# Patient Record
Sex: Male | Born: 1955
Health system: Southern US, Community
[De-identification: ages and names within clinical notes are randomized; demographics above are authoritative.]

## PROBLEM LIST (undated history)

## (undated) DIAGNOSIS — B1710 Acute hepatitis C without hepatic coma: Secondary | ICD-10-CM

## (undated) DIAGNOSIS — E739 Lactose intolerance, unspecified: Secondary | ICD-10-CM

## (undated) DIAGNOSIS — M1710 Unilateral primary osteoarthritis, unspecified knee: Secondary | ICD-10-CM

## (undated) DIAGNOSIS — J309 Allergic rhinitis, unspecified: Secondary | ICD-10-CM

## (undated) DIAGNOSIS — E785 Hyperlipidemia, unspecified: Secondary | ICD-10-CM

## (undated) DIAGNOSIS — M25569 Pain in unspecified knee: Secondary | ICD-10-CM

## (undated) DIAGNOSIS — R7302 Impaired glucose tolerance (oral): Secondary | ICD-10-CM

## (undated) DIAGNOSIS — M76899 Other specified enthesopathies of unspecified lower limb, excluding foot: Secondary | ICD-10-CM

## (undated) HISTORY — DX: Allergic rhinitis, unspecified: J30.9

## (undated) HISTORY — DX: Unilateral primary osteoarthritis, unspecified knee: M17.10

## (undated) HISTORY — DX: Acute hepatitis C without hepatic coma: B17.10

## (undated) HISTORY — DX: Impaired glucose tolerance (oral): R73.02

## (undated) HISTORY — DX: Lactose intolerance, unspecified: E73.9

## (undated) HISTORY — DX: Hyperlipidemia, unspecified: E78.5

## (undated) HISTORY — DX: Other specified enthesopathies of unspecified lower limb, excluding foot: M76.899

## (undated) HISTORY — DX: Pain in unspecified knee: M25.569

---

## 2003-03-21 ENCOUNTER — Ambulatory Visit (HOSPITAL_COMMUNITY): Admission: RE | Admit: 2003-03-21 | Discharge: 2003-03-21 | Payer: Self-pay | Admitting: Orthopedic Surgery

## 2004-04-09 ENCOUNTER — Ambulatory Visit (HOSPITAL_COMMUNITY): Admission: RE | Admit: 2004-04-09 | Discharge: 2004-04-09 | Payer: Self-pay | Admitting: Internal Medicine

## 2004-04-09 ENCOUNTER — Encounter (INDEPENDENT_AMBULATORY_CARE_PROVIDER_SITE_OTHER): Payer: Self-pay | Admitting: Specialist

## 2004-09-16 ENCOUNTER — Ambulatory Visit: Payer: Self-pay | Admitting: Internal Medicine

## 2004-09-20 ENCOUNTER — Ambulatory Visit: Payer: Self-pay | Admitting: Internal Medicine

## 2005-04-22 ENCOUNTER — Ambulatory Visit: Payer: Self-pay | Admitting: Internal Medicine

## 2005-10-10 ENCOUNTER — Ambulatory Visit: Payer: Self-pay | Admitting: Internal Medicine

## 2005-10-18 ENCOUNTER — Ambulatory Visit: Payer: Self-pay | Admitting: Internal Medicine

## 2006-10-16 ENCOUNTER — Ambulatory Visit: Payer: Self-pay | Admitting: Internal Medicine

## 2006-10-16 LAB — CONVERTED CEMR LAB
AST: 52 units/L — ABNORMAL HIGH (ref 0–37)
Basophils Absolute: 0 10*3/uL (ref 0.0–0.1)
Calcium: 9.2 mg/dL (ref 8.4–10.5)
Creatinine, Ser: 1.1 mg/dL (ref 0.4–1.5)
Eosinophil percent: 7.3 % — ABNORMAL HIGH (ref 0.0–5.0)
GFR calc non Af Amer: 75 mL/min
Hemoglobin: 14.6 g/dL (ref 13.0–17.0)
Ketones, ur: NEGATIVE mg/dL
LDL Cholesterol: 123 mg/dL — ABNORMAL HIGH (ref 0–99)
Leukocytes, UA: NEGATIVE
Monocytes Absolute: 0.5 10*3/uL (ref 0.2–0.7)
Neutrophils Relative %: 35.2 % — ABNORMAL LOW (ref 43.0–77.0)
PSA: 1 ng/mL (ref 0.10–4.00)
Potassium: 3.9 meq/L (ref 3.5–5.1)
RDW: 12.5 % (ref 11.5–14.6)
Specific Gravity, Urine: 1.025 (ref 1.000–1.03)
Total Protein, Urine: NEGATIVE mg/dL
Total Protein: 7.9 g/dL (ref 6.0–8.3)
Urine Glucose: NEGATIVE mg/dL
pH: 5.5 (ref 5.0–8.0)

## 2006-11-30 ENCOUNTER — Ambulatory Visit: Payer: Self-pay | Admitting: Internal Medicine

## 2007-07-17 ENCOUNTER — Emergency Department (HOSPITAL_COMMUNITY): Admission: EM | Admit: 2007-07-17 | Discharge: 2007-07-17 | Payer: Self-pay | Admitting: Emergency Medicine

## 2007-11-27 ENCOUNTER — Ambulatory Visit: Payer: Self-pay | Admitting: Internal Medicine

## 2007-11-27 LAB — CONVERTED CEMR LAB
ALT: 108 units/L — ABNORMAL HIGH (ref 0–53)
Albumin: 3.6 g/dL (ref 3.5–5.2)
Alkaline Phosphatase: 51 units/L (ref 39–117)
BUN: 9 mg/dL (ref 6–23)
Basophils Relative: 0.7 % (ref 0.0–1.0)
CO2: 30 meq/L (ref 19–32)
Calcium: 9.2 mg/dL (ref 8.4–10.5)
Chloride: 107 meq/L (ref 96–112)
Cholesterol: 202 mg/dL (ref 0–200)
Eosinophils Relative: 12.1 % — ABNORMAL HIGH (ref 0.0–5.0)
Glucose, Bld: 109 mg/dL — ABNORMAL HIGH (ref 70–99)
HCT: 42.4 % (ref 39.0–52.0)
Ketones, ur: NEGATIVE mg/dL
MCV: 93 fL (ref 78.0–100.0)
Monocytes Absolute: 0.5 10*3/uL (ref 0.2–0.7)
Neutro Abs: 1.9 10*3/uL (ref 1.4–7.7)
PSA: 0.69 ng/mL (ref 0.10–4.00)
Potassium: 3.8 meq/L (ref 3.5–5.1)
RBC: 4.56 M/uL (ref 4.22–5.81)
Sodium: 143 meq/L (ref 135–145)
Specific Gravity, Urine: 1.02 (ref 1.000–1.03)
TSH: 3.04 microintl units/mL (ref 0.35–5.50)
Total Bilirubin: 1 mg/dL (ref 0.3–1.2)
Total Protein, Urine: NEGATIVE mg/dL
Total Protein: 7.7 g/dL (ref 6.0–8.3)
Triglycerides: 78 mg/dL (ref 0–149)
Urine Glucose: NEGATIVE mg/dL
Urobilinogen, UA: 0.2 (ref 0.0–1.0)

## 2007-12-05 ENCOUNTER — Ambulatory Visit: Payer: Self-pay | Admitting: Internal Medicine

## 2007-12-05 DIAGNOSIS — E785 Hyperlipidemia, unspecified: Secondary | ICD-10-CM

## 2007-12-05 DIAGNOSIS — B182 Chronic viral hepatitis C: Secondary | ICD-10-CM | POA: Insufficient documentation

## 2007-12-05 DIAGNOSIS — J309 Allergic rhinitis, unspecified: Secondary | ICD-10-CM | POA: Insufficient documentation

## 2007-12-05 DIAGNOSIS — B1710 Acute hepatitis C without hepatic coma: Secondary | ICD-10-CM

## 2007-12-05 DIAGNOSIS — E739 Lactose intolerance, unspecified: Secondary | ICD-10-CM | POA: Insufficient documentation

## 2007-12-05 HISTORY — DX: Allergic rhinitis, unspecified: J30.9

## 2007-12-05 HISTORY — DX: Hyperlipidemia, unspecified: E78.5

## 2007-12-05 HISTORY — DX: Lactose intolerance, unspecified: E73.9

## 2007-12-05 HISTORY — DX: Acute hepatitis C without hepatic coma: B17.10

## 2008-04-30 ENCOUNTER — Ambulatory Visit: Payer: Self-pay | Admitting: Internal Medicine

## 2008-04-30 DIAGNOSIS — M76899 Other specified enthesopathies of unspecified lower limb, excluding foot: Secondary | ICD-10-CM

## 2008-04-30 DIAGNOSIS — IMO0002 Reserved for concepts with insufficient information to code with codable children: Secondary | ICD-10-CM

## 2008-04-30 DIAGNOSIS — M1710 Unilateral primary osteoarthritis, unspecified knee: Secondary | ICD-10-CM

## 2008-04-30 HISTORY — DX: Other specified enthesopathies of unspecified lower limb, excluding foot: M76.899

## 2008-04-30 HISTORY — DX: Reserved for concepts with insufficient information to code with codable children: IMO0002

## 2009-01-06 ENCOUNTER — Ambulatory Visit: Payer: Self-pay | Admitting: Internal Medicine

## 2009-01-06 LAB — CONVERTED CEMR LAB
ALT: 125 units/L — ABNORMAL HIGH (ref 0–53)
Albumin: 3.5 g/dL (ref 3.5–5.2)
Alkaline Phosphatase: 52 units/L (ref 39–117)
Bilirubin, Direct: 0.1 mg/dL (ref 0.0–0.3)
Cholesterol: 197 mg/dL (ref 0–200)
Eosinophils Absolute: 0.5 10*3/uL (ref 0.0–0.7)
Eosinophils Relative: 11.8 % — ABNORMAL HIGH (ref 0.0–5.0)
Glucose, Bld: 110 mg/dL — ABNORMAL HIGH (ref 70–99)
HCT: 41.5 % (ref 39.0–52.0)
Hemoglobin: 13.8 g/dL (ref 13.0–17.0)
Leukocytes, UA: NEGATIVE
MCHC: 33.3 g/dL (ref 30.0–36.0)
MCV: 94.7 fL (ref 78.0–100.0)
Monocytes Relative: 12.1 % — ABNORMAL HIGH (ref 3.0–12.0)
Neutro Abs: 1.3 10*3/uL — ABNORMAL LOW (ref 1.4–7.7)
Neutrophils Relative %: 29 % — ABNORMAL LOW (ref 43.0–77.0)
PSA: 0.74 ng/mL (ref 0.10–4.00)
Platelets: 201 10*3/uL (ref 150–400)
Potassium: 3.8 meq/L (ref 3.5–5.1)
RBC: 4.38 M/uL (ref 4.22–5.81)
RDW: 12.7 % (ref 11.5–14.6)
Sodium: 145 meq/L (ref 135–145)
Total CHOL/HDL Ratio: 3.4
Total Protein: 7.5 g/dL (ref 6.0–8.3)
Urine Glucose: NEGATIVE mg/dL
VLDL: 16 mg/dL (ref 0–40)
pH: 5.5 (ref 5.0–8.0)

## 2009-02-24 ENCOUNTER — Ambulatory Visit: Payer: Self-pay | Admitting: Internal Medicine

## 2009-04-14 ENCOUNTER — Ambulatory Visit: Payer: Self-pay | Admitting: Gastroenterology

## 2009-04-28 ENCOUNTER — Encounter: Payer: Self-pay | Admitting: Gastroenterology

## 2009-04-28 ENCOUNTER — Encounter: Payer: Self-pay | Admitting: Internal Medicine

## 2009-04-28 ENCOUNTER — Ambulatory Visit: Payer: Self-pay | Admitting: Gastroenterology

## 2009-04-29 ENCOUNTER — Encounter: Payer: Self-pay | Admitting: Gastroenterology

## 2009-10-07 ENCOUNTER — Ambulatory Visit: Payer: Self-pay | Admitting: Internal Medicine

## 2009-10-19 ENCOUNTER — Ambulatory Visit: Payer: Self-pay | Admitting: Internal Medicine

## 2010-03-04 ENCOUNTER — Ambulatory Visit: Payer: Self-pay | Admitting: Internal Medicine

## 2010-03-04 LAB — CONVERTED CEMR LAB
AST: 89 units/L — ABNORMAL HIGH (ref 0–37)
Alkaline Phosphatase: 49 units/L (ref 39–117)
Basophils Relative: 1.2 % (ref 0.0–3.0)
Chloride: 106 meq/L (ref 96–112)
Eosinophils Relative: 11.5 % — ABNORMAL HIGH (ref 0.0–5.0)
GFR calc non Af Amer: 99.75 mL/min (ref >60)
HCV Ab: REACTIVE — AB
HDL: 63.6 mg/dL (ref >39.00)
Hemoglobin: 14.1 g/dL (ref 13.0–17.0)
Hep B C IgM: NEGATIVE
Hepatitis B Surface Ag: NEGATIVE
Ketones, ur: NEGATIVE mg/dL
Leukocytes, UA: NEGATIVE
Lymphocytes Relative: 46.6 % — ABNORMAL HIGH (ref 12.0–46.0)
MCHC: 33.7 g/dL (ref 30.0–36.0)
MCV: 94 fL (ref 78.0–100.0)
RBC: 4.44 M/uL (ref 4.22–5.81)
TSH: 2.45 microintl units/mL (ref 0.35–5.50)
Total Bilirubin: 1 mg/dL (ref 0.3–1.2)
Total Protein: 8.3 g/dL (ref 6.0–8.3)
Urobilinogen, UA: 0.2 (ref 0.0–1.0)
pH: 5.5 (ref 5.0–8.0)

## 2010-03-09 ENCOUNTER — Ambulatory Visit: Payer: Self-pay | Admitting: Internal Medicine

## 2010-03-09 DIAGNOSIS — M25569 Pain in unspecified knee: Secondary | ICD-10-CM

## 2010-03-09 HISTORY — DX: Pain in unspecified knee: M25.569

## 2010-03-10 ENCOUNTER — Encounter (INDEPENDENT_AMBULATORY_CARE_PROVIDER_SITE_OTHER): Payer: Self-pay | Admitting: *Deleted

## 2010-05-03 ENCOUNTER — Encounter: Payer: Self-pay | Admitting: Internal Medicine

## 2010-10-21 ENCOUNTER — Ambulatory Visit: Payer: Self-pay | Admitting: Internal Medicine

## 2010-12-14 NOTE — Letter (Signed)
Summary: [facility]  [facility]   Imported By: Sherian Rein 05/13/2010 12:26:36  _____________________________________________________________________  External Attachment:    Type:   Image     Comment:   External Document

## 2010-12-14 NOTE — Assessment & Plan Note (Signed)
Summary: [facility]X/[facility]/CD   Vital Signs:  Patient profile:   55 year old male Height:      66 inches Weight:      182.50 pounds BMI:     29.56 O2 Sat:      96 % on Room air Temp:     98.1 degrees F oral Pulse rate:   72 / minute BP sitting:   120 / 82  (left arm) Cuff size:   regular  Vitals Entered ByZella Ball Ewing (March 09, 2010 8:23 AM)  O2 Flow:  Room air  CC: Adult Physical/Re   CC:  Adult Physical/Re.  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  Pt denies CP, sob, doe, wheezing, orthopnea, pnd, worsening LE edema, palps, dizziness or syncope Pt denies new neuro symptoms such as headache, facial or extremity weakness   Problems Prior to Update: 1)  Preventive Health Care  (ICD-V70.0) 2)  Bursitis, Left Hip  (ICD-726.5) 3)  Osteoarthritis, Knees, Bilateral  (ICD-715.96) 4)  Glucose Intolerance  (ICD-271.3) 5)  Allergic Rhinitis  (ICD-477.9) 6)  Hepatitis C  (ICD-070.51) 7)  Hyperlipidemia  (ICD-272.4) 8)  Preventive Health Care  (ICD-V70.0) 9)  Routine General Medical Exam@health  Care Facl  (ICD-V70.0)  Medications Prior to Update: 1)  Fexofenadine Hcl 180 Mg  Tabs (Fexofenadine Hcl) .Marland Kitchen.. 1 By Mouth Once Daily 2)  Meloxicam 15 Mg  Tabs (Meloxicam) .Marland Kitchen.. 1 By Mouth Once Daily As Needed 3)  Pravastatin Sodium 40 Mg Tabs (Pravastatin Sodium) .Marland Kitchen.. 1 By Mouth Once Daily 4)  Adult Aspirin Ec Low Strength 81 Mg Tbec (Aspirin) .Marland Kitchen.. 1po Once Daily 5)  Pravastatin Sodium 40 Mg Tabs (Pravastatin Sodium) .Marland Kitchen.. 1 By Mouth Once Daily  Current Medications (verified): 1)  Fexofenadine Hcl 180 Mg  Tabs (Fexofenadine Hcl) .Marland Kitchen.. 1 By Mouth Once Daily 2)  Meloxicam 15 Mg  Tabs (Meloxicam) .Marland Kitchen.. 1 By  Mouth Once Daily As Needed For Pain 3)  Pravastatin Sodium 40 Mg Tabs (Pravastatin Sodium) .Marland Kitchen.. 1 By Mouth Once Daily 4)  Adult Aspirin Ec Low Strength 81 Mg Tbec (Aspirin) .Marland Kitchen.. 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         all otherwise negative per pt -    Physical Exam  General:  alert and well-developed.  , mild overwt Head:  normocephalic and atraumatic.   Eyes:  vision grossly intact, pupils equal, and pupils round.  Ears:  R ear normal and L ear normal.   Nose:  no external deformity and no nasal discharge.   Mouth:  no gingival abnormalities and pharynx pink and moist.   Neck:  supple and no masses.   Lungs:  normal respiratory effort and normal breath sounds.   Heart:  normal rate and regular rhythm.   Abdomen:  soft, non-tender, and normal bowel sounds.   Msk:  no joint tenderness and no joint swelling but  has bilat mld knee crepitus and FROM, also with some tender over left AC joint.   Extremities:   no edema, no erythema  Neurologic:  cranial nerves II-XII intact and strength normal in all extremities.   Skin:  color normal and no rashes.   Psych:  not anxious appearing and not depressed appearing.     Impression & Recommendations:  Problem # 1:  Preventive Health Care (ICD-V70.0) Overall doing well, age appropriate education and counseling updated and referral for appropriate preventive services done unless declined, immunizations up to date or declined, diet counseling done if overweight, urged to quit smoking if smokes , most recent labs reviewed and current ordered if appropriate, ecg reviewed or declined (interpretation per ECG scanned in the EMR if done); information regarding Medicare Prevention requirements given if appropriate  Orders: EKG w/ Interpretation (93000)  Problem # 2:  KNEE PAIN, BILATERAL (ICD-719.46)  His updated medication list for this problem includes:    Meloxicam 15 Mg Tabs (Meloxicam) .Marland Kitchen... 1 by mouth once daily as needed for pain    Adult Aspirin Ec Low Strength 81 Mg Tbec (Aspirin) .Marland Kitchen... 1po once daily Continue all previous medications as before this visit , as well as tylenol as needed, with incr pain and effusions will refer to ortho  Orders: Orthopedic Surgeon Referral (Ortho Surgeon)  Complete Medication List: 1)  Fexofenadine Hcl 180 Mg Tabs (Fexofenadine hcl) .Marland Kitchen.. 1 by mouth once daily 2)  Meloxicam 15 Mg Tabs (Meloxicam) .Marland Kitchen.. 1 by mouth once daily as needed for pain 3)  Pravastatin Sodium 40 Mg Tabs (Pravastatin sodium) .Marland Kitchen.. 1 by mouth once daily 4)  Adult Aspirin Ec Low Strength 81 Mg Tbec (Aspirin) .Marland Kitchen.. 1po once daily  Patient Instructions: 1)  Continue all previous medications as before this visit  2)  You will be contacted about the referral(s) to: orthopedic 3)  You can also use Tylenol arthritis or it's generic for congestion  4)  Please schedule a follow-up appointment in 1 year or sooner if needed Prescriptions: PRAVASTATIN SODIUM  40 MG TABS (PRAVASTATIN SODIUM) 1 by mouth once daily  #90 x 3   Entered and Authorized by:   Corwin Levins MD   Signed by:   Corwin Levins MD on 03/09/2010   Method used:   Print then Give to Patient   RxID:   [id number] MELOXICAM 15 MG  TABS (MELOXICAM) 1 by mouth once daily as needed for pain  #90 x 3   Entered and Authorized by:   Corwin Levins MD   Signed by:   Corwin Levins MD on 03/09/2010   Method used:   Print then Give to Patient   RxID:   [id number] FEXOFENADINE HCL 180 MG  TABS (FEXOFENADINE HCL) 1 by mouth once daily  #90 x 3   Entered and Authorized by:   Corwin Levins MD   Signed by:   Corwin Levins MD on 03/09/2010   Method used:   Print then Give to  Patient   RxID:   [id number]

## 2010-12-14 NOTE — Letter (Signed)
Summary: [facility] Consult Scheduled Letter  [facility]  [address] [place], [place] [address]   Phone: [phone]  Fax: [phone]      03/10/2010 MRN: [id number]  Manuel Berger [address] [place], [place]  [address]    Dear [name],      We have scheduled an appointment for you.  At the recommendation of Dr.John, we have scheduled you a consult with Dr Rennis Chris on 03/17/10 at 8:00am.  Their phone number is [phone].  If this appointment day and time is not convenient for you, please feel free to call the office of the doctor you are being referred to at the number listed above and reschedule the appointment.    [facility] [address] [place], [place] [address]    Thank you,  Patient Care Coordinator [facility]

## 2011-03-14 ENCOUNTER — Other Ambulatory Visit (INDEPENDENT_AMBULATORY_CARE_PROVIDER_SITE_OTHER): Payer: 59

## 2011-03-14 ENCOUNTER — Other Ambulatory Visit: Payer: Self-pay

## 2011-03-14 DIAGNOSIS — Z0000 Encounter for general adult medical examination without abnormal findings: Secondary | ICD-10-CM

## 2011-03-14 DIAGNOSIS — Z1289 Encounter for screening for malignant neoplasm of other sites: Secondary | ICD-10-CM

## 2011-03-14 DIAGNOSIS — E785 Hyperlipidemia, unspecified: Secondary | ICD-10-CM

## 2011-03-14 LAB — LIPID PANEL: Total CHOL/HDL Ratio: 3

## 2011-03-14 LAB — BASIC METABOLIC PANEL
CO2: 29 mEq/L (ref 19–32)
Creatinine, Ser: 0.9 mg/dL (ref 0.4–1.5)
GFR: 116.7 mL/min (ref >60.00)
Glucose, Bld: 89 mg/dL (ref 70–99)
Potassium: 4 mEq/L (ref 3.5–5.1)
Sodium: 139 mEq/L (ref 135–145)

## 2011-03-14 LAB — PSA: PSA: 0.89 ng/mL (ref 0.10–4.00)

## 2011-03-14 LAB — URINALYSIS
Nitrite: NEGATIVE
Total Protein, Urine: NEGATIVE
Urine Glucose: NEGATIVE
Urobilinogen, UA: 0.2 (ref 0.0–1.0)

## 2011-03-14 LAB — CBC WITH DIFFERENTIAL/PLATELET
Eosinophils Relative: 6.4 % — ABNORMAL HIGH (ref 0.0–5.0)
Hemoglobin: 14.3 g/dL (ref 13.0–17.0)
Lymphocytes Relative: 49.1 % — ABNORMAL HIGH (ref 12.0–46.0)
Monocytes Absolute: 0.7 10*3/uL (ref 0.1–1.0)
Neutrophils Relative %: 32.9 % — ABNORMAL LOW (ref 43.0–77.0)
RBC: 4.47 Mil/uL (ref 4.22–5.81)
WBC: 6.4 10*3/uL (ref 4.5–10.5)

## 2011-03-14 LAB — TSH: TSH: 3.21 u[IU]/mL (ref 0.35–5.50)

## 2011-03-14 LAB — HEPATIC FUNCTION PANEL: Total Protein: 7.5 g/dL (ref 6.0–8.3)

## 2011-03-17 ENCOUNTER — Other Ambulatory Visit: Payer: Self-pay | Admitting: Internal Medicine

## 2011-03-17 DIAGNOSIS — Z1289 Encounter for screening for malignant neoplasm of other sites: Secondary | ICD-10-CM

## 2011-03-17 DIAGNOSIS — Z0000 Encounter for general adult medical examination without abnormal findings: Secondary | ICD-10-CM

## 2011-03-23 ENCOUNTER — Encounter: Payer: Self-pay | Admitting: Internal Medicine

## 2011-03-23 DIAGNOSIS — Z0000 Encounter for general adult medical examination without abnormal findings: Secondary | ICD-10-CM | POA: Insufficient documentation

## 2011-03-23 DIAGNOSIS — R739 Hyperglycemia, unspecified: Secondary | ICD-10-CM | POA: Insufficient documentation

## 2011-03-23 DIAGNOSIS — R7302 Impaired glucose tolerance (oral): Secondary | ICD-10-CM

## 2011-03-23 HISTORY — DX: Impaired glucose tolerance (oral): R73.02

## 2011-03-24 ENCOUNTER — Encounter: Payer: Self-pay | Admitting: Internal Medicine

## 2011-03-24 ENCOUNTER — Ambulatory Visit (INDEPENDENT_AMBULATORY_CARE_PROVIDER_SITE_OTHER)
Admission: RE | Admit: 2011-03-24 | Discharge: 2011-03-24 | Disposition: A | Discharge status: Home / Self Care | Payer: 59 | Source: Ambulatory Visit | Attending: Internal Medicine | Admitting: Internal Medicine

## 2011-03-24 ENCOUNTER — Ambulatory Visit (INDEPENDENT_AMBULATORY_CARE_PROVIDER_SITE_OTHER): Payer: 59 | Admitting: Internal Medicine

## 2011-03-24 VITALS — BP 122/80 | HR 65 | Temp 99.0°F | Ht 66.0 in | Wt 179.2 lb

## 2011-03-24 DIAGNOSIS — Z136 Encounter for screening for cardiovascular disorders: Secondary | ICD-10-CM

## 2011-03-24 DIAGNOSIS — R7302 Impaired glucose tolerance (oral): Secondary | ICD-10-CM

## 2011-03-24 DIAGNOSIS — S93401A Sprain of unspecified ligament of right ankle, initial encounter: Secondary | ICD-10-CM

## 2011-03-24 DIAGNOSIS — S93409A Sprain of unspecified ligament of unspecified ankle, initial encounter: Secondary | ICD-10-CM

## 2011-03-24 DIAGNOSIS — R7309 Other abnormal glucose: Secondary | ICD-10-CM

## 2011-03-24 DIAGNOSIS — Z0000 Encounter for general adult medical examination without abnormal findings: Secondary | ICD-10-CM

## 2011-03-24 MED ORDER — TRAMADOL HCL 50 MG PO TABS: 50.0000 mg | ORAL_TABLET | Freq: Four times a day (QID) | ORAL | Status: AC | PRN

## 2011-03-24 NOTE — Progress Notes (Signed)
Subjective:    Patient ID: Manuel Berger, male    DOB: 07-25-1956, 55 y.o.   MRN: [id number]  HPI Here for wellness and f/u;  Overall doing ok;  Pt denies CP, worsening SOB, DOE, wheezing, orthopnea, PND, worsening LE edema, palpitations, dizziness or syncope.  Pt denies neurological change such as new Headache, facial or extremity weakness.  Pt denies polydipsia, polyuria, or low sugar symptoms. Pt states overall good compliance with treatment and medications, good tolerability, and trying to follow lower cholesterol diet.  Pt denies worsening depressive symptoms, suicidal ideation or panic. No fever, wt loss, night sweats, loss of appetite, or other constitutional symptoms.  Pt states good ability with ADL's, low fall risk, home safety reviewed and adequate, no significant changes in hearing or vision, and occasionally active with exercise.  Has some ongoing knee pain, better with bilat cortisone last yr, but not bad enough at this time to return to ortho;  No giveaways, no sweling,  Not taking the mobic at this time  Here with pain to the right lateral ankle after twisting the ankle 2 wks ago, still hurts, but swelling , worse pain to walk,.  Also pain to left elbow for 2 mo, has physical job, uses arms at work, but pain mostly at the tip of the elbow, no trauma .  Past Medical History  Diagnosis Date  . HEPATITIS C 12/05/2007  . GLUCOSE INTOLERANCE 12/05/2007  . HYPERLIPIDEMIA 12/05/2007  . ALLERGIC RHINITIS 12/05/2007  . OSTEOARTHRITIS, KNEES, BILATERAL 04/30/2008  . KNEE PAIN, BILATERAL 03/09/2010  . BURSITIS, LEFT HIP 04/30/2008  . Impaired glucose tolerance 03/23/2011   No past surgical history on file.  reports that he has never smoked. He does not have any smokeless tobacco history on file. He reports that he does not drink alcohol. His drug history not on file. family history includes COPD in his father; Diabetes in his mother; Heart disease in his father; and Hypertension in his mother. No  Known Allergies Current Outpatient Prescriptions on File Prior to Visit  Medication Sig Dispense Refill  . aspirin 81 MG EC tablet Take 81 mg by mouth daily.        . fexofenadine (ALLEGRA) 180 MG tablet Take 180 mg by mouth daily.        . pravastatin (PRAVACHOL) 40 MG tablet Take 40 mg by mouth daily.        . meloxicam (MOBIC) 15 MG tablet Take 15 mg by mouth daily.         Review of Systems Review of Systems  Constitutional: Negative for diaphoresis, activity change, appetite change and unexpected weight change.  HENT: Negative for hearing loss, ear pain, facial swelling, mouth sores and neck stiffness.   Eyes: Negative for pain, redness and visual disturbance.  Respiratory: Negative for shortness of breath and wheezing.   Cardiovascular: Negative for chest pain and palpitations.  Gastrointestinal: Negative for diarrhea, blood in stool, abdominal distention and rectal pain.  Genitourinary: Negative for hematuria, flank pain and decreased urine volume.  Musculoskeletal: Negative for myalgias and joint swelling.  Skin: Negative for color change and wound.  Neurological: Negative for syncope and numbness.  Hematological: Negative for adenopathy.  Psychiatric/Behavioral: Negative for hallucinations, self-injury, decreased concentration and agitation.      Objective:   Physical Exam BP 122/80  Pulse 65  Temp(Src) 99 F (37.2 C) (Oral)  Ht 5\' 6"  (1.676 m)  Wt 179 lb 4 oz (81.307 kg)  BMI 28.93 kg/m2  SpO2 97%  Physical Exam  VS noted Constitutional: Pt is oriented to person, place, and time. Appears well-developed and well-nourished.  HENT:  Head: Normocephalic and atraumatic.  Right Ear: External ear normal.  Left Ear: External ear normal.  Nose: Nose normal.  Mouth/Throat: Oropharynx is clear and moist.  Eyes: Conjunctivae and EOM are normal. Pupils are equal, round, and reactive to light.  Neck: Normal range of motion. Neck supple. No JVD present. No tracheal deviation  present.  Cardiovascular: Normal rate, regular rhythm, normal heart sounds and intact distal pulses.   Pulmonary/Chest: Effort normal and breath sounds normal.  Abdominal: Soft. Bowel sounds are normal. There is no tenderness.  Musculoskeletal: Normal range of motion. Exhibits no edema. except for right lateral ankle with effusion about the lateral malleous and tender tarsal tunnel area, worse pain to inversion of ankle; left elbow with ? Mild bursitis, but minimal effusion at best, nontender, FROM Lymphadenopathy:  Has no cervical adenopathy.  Neurological: Pt is alert and oriented to person, place, and time. Pt has normal reflexes. No cranial nerve deficit.  Skin: Skin is warm and dry. No rash noted.  Psychiatric:  Has  normal mood and affect. Behavior is normal.        Assessment & Plan:

## 2011-03-24 NOTE — Progress Notes (Signed)
Quick Note:  Voice message left on [employer] system - lab is negative, normal or otherwise stable, pt to continue same tx ______ 

## 2011-03-24 NOTE — Assessment & Plan Note (Signed)

## 2011-03-24 NOTE — Assessment & Plan Note (Signed)
Mild to mod, persistent pain and swelling , for right ankle film today, pain med prn

## 2011-03-24 NOTE — Patient Instructions (Signed)
Take all new medications as prescribed Continue all other medications as before Please go to XRAY in the Basement for the x-ray test Please call the number on the [facility] Card (the [employer] System) for results of testing in 2-3 days Your medications were refilled today Please return in 1 year for your yearly visit, or sooner if needed, with Lab testing done 3-5 days before

## 2011-03-30 ENCOUNTER — Other Ambulatory Visit: Payer: Self-pay | Admitting: Internal Medicine

## 2011-04-01 NOTE — Op Note (Signed)
NAME:  Manuel Berger, Manuel Berger                       ACCOUNT NO.:  [id number]   MEDICAL RECORD NO.:  [id number]                   PATIENT TYPE:  OIB   LOCATION:  NA                                   FACILITY:  [facility]   PHYSICIAN:  Almedia Balls. Ranell Patrick, M.D.              DATE OF BIRTH:  02-07-1956   DATE OF PROCEDURE:  03/21/2003  DATE OF DISCHARGE:                                 OPERATIVE REPORT   PREOPERATIVE DIAGNOSIS:  Right triceps tear.   POSTOPERATIVE DIAGNOSIS:  Right triceps tear.   PROCEDURE:  Open repair of right triceps tendon, right arm.   SURGEON:  Almedia Balls. Ranell Patrick, M.D.   ASSISTANT:  Clarene Reamer, P.A.-C.   ANESTHESIA:  General anesthesia.   ESTIMATED BLOOD LOSS:  Minimal.   TOURNIQUET TIME:  35 minutes.   COUNTS:  Instrument count was correct.   COMPLICATIONS:  None.   INDICATIONS:  The patient is a 55 year old male who sustained an injury to  his right arm when it was forcefully flexed against his extended arm.  The  patient complained of immediate severe pain in the right elbow with swelling  and ecchymosis.  X-rays demonstrate an avulsion of the triceps where the  small bones appear attached.  The patient clinically had the same, was  unable to actively extend his arm, with a palpable defect.  After counseling  the patient regarding the need for repairing the extensor mechanism of the  elbow, the patient consented to surgery.  After preoperative clearance by  medicine, the patient was taken back to surgery.   DESCRIPTION OF PROCEDURE:  After an adequate level of anesthesia achieved,  the patient was positioned supine on the operating table and a nonsterile  tourniquet was placed on the right proximal arm.  The right arm was then  prepped and draped in the usual sterile fashion.  After completing this,  using the Esmarch bandage, the tourniquet was elevated to 275 mmHg.  A  longitudinal incision was created over the elbow and dissection was carried  down  sharply down to the triceps, which was identified.  There was a full-  thickness tear noted off the olecranon.  A bone spur was removed from the  free edge of the triceps tendon.  Two #2 Fibrewire sutures were placed in a  [place] baseball-stitch-type fashion into the free edge of the tendon and  used to reapproximate the tendon directly through drill holes into the  olecranon process, which was freshened up using a rongeur and a curette.  An  excellent repair was achieved.  This was augmented with #1 Vicryl in the  retinacular tissues in a figure-of-eight interrupted fashion.  Care was  taken to preserve the ulnar nerve, which was not visualized and was noted to  be  outside the operative field.  At this point thorough irrigation was  performed, performed by 2-0 Vicryl closure and staples for the skin and  sterile dressings applied with the elbow splinted in extension.  The patient  tolerated the procedure well and was taken to the recovery room in stable  condition.                                               Almedia Balls. Ranell Patrick, M.D.    SRN/MEDQ  D:  03/21/2003  T:  03/24/2003  Job:  [id number]

## 2011-10-14 ENCOUNTER — Ambulatory Visit (INDEPENDENT_AMBULATORY_CARE_PROVIDER_SITE_OTHER): Payer: 59

## 2011-10-14 DIAGNOSIS — Z23 Encounter for immunization: Secondary | ICD-10-CM

## 2012-03-16 ENCOUNTER — Other Ambulatory Visit (INDEPENDENT_AMBULATORY_CARE_PROVIDER_SITE_OTHER): Payer: 59

## 2012-03-16 ENCOUNTER — Encounter: Payer: Self-pay | Admitting: Internal Medicine

## 2012-03-16 DIAGNOSIS — R7302 Impaired glucose tolerance (oral): Secondary | ICD-10-CM

## 2012-03-16 DIAGNOSIS — R7309 Other abnormal glucose: Secondary | ICD-10-CM

## 2012-03-16 DIAGNOSIS — Z0000 Encounter for general adult medical examination without abnormal findings: Secondary | ICD-10-CM

## 2012-03-16 LAB — CBC WITH DIFFERENTIAL/PLATELET
Basophils Absolute: 0.1 10*3/uL (ref 0.0–0.1)
Basophils Relative: 0.9 % (ref 0.0–3.0)
Eosinophils Absolute: 0.5 10*3/uL (ref 0.0–0.7)
HCT: 43.5 % (ref 39.0–52.0)
Hemoglobin: 14.4 g/dL (ref 13.0–17.0)
Lymphocytes Relative: 41.9 % (ref 12.0–46.0)
Lymphs Abs: 2.4 10*3/uL (ref 0.7–4.0)
MCHC: 33.2 g/dL (ref 30.0–36.0)
MCV: 93.3 fl (ref 78.0–100.0)
Monocytes Absolute: 0.6 10*3/uL (ref 0.1–1.0)
Neutro Abs: 2.1 10*3/uL (ref 1.4–7.7)
RBC: 4.66 Mil/uL (ref 4.22–5.81)
RDW: 13.8 % (ref 11.5–14.6)

## 2012-03-16 LAB — URINALYSIS, ROUTINE W REFLEX MICROSCOPIC
Bilirubin Urine: NEGATIVE
Hgb urine dipstick: NEGATIVE
Ketones, ur: NEGATIVE
Leukocytes, UA: NEGATIVE
Specific Gravity, Urine: 1.015 (ref 1.000–1.030)
Urine Glucose: NEGATIVE
Urobilinogen, UA: 0.2 (ref 0.0–1.0)

## 2012-03-16 LAB — LIPID PANEL
Cholesterol: 164 mg/dL (ref 0–200)
HDL: 68.6 mg/dL (ref >39.00)
LDL Cholesterol: 84 mg/dL (ref 0–99)
Triglycerides: 57 mg/dL (ref 0.0–149.0)

## 2012-03-16 LAB — HEPATIC FUNCTION PANEL
Albumin: 3.8 g/dL (ref 3.5–5.2)
Alkaline Phosphatase: 58 U/L (ref 39–117)
Total Protein: 7.6 g/dL (ref 6.0–8.3)

## 2012-03-16 LAB — BASIC METABOLIC PANEL
CO2: 28 mEq/L (ref 19–32)
Chloride: 105 mEq/L (ref 96–112)
Creatinine, Ser: 0.9 mg/dL (ref 0.4–1.5)
Sodium: 140 mEq/L (ref 135–145)

## 2012-03-16 LAB — PSA: PSA: 0.77 ng/mL (ref 0.10–4.00)

## 2012-03-26 ENCOUNTER — Encounter: Payer: Self-pay | Admitting: Internal Medicine

## 2012-03-26 ENCOUNTER — Ambulatory Visit (INDEPENDENT_AMBULATORY_CARE_PROVIDER_SITE_OTHER): Payer: 59 | Admitting: Internal Medicine

## 2012-03-26 VITALS — BP 112/84 | HR 66 | Temp 97.7°F | Ht 66.0 in | Wt 183.0 lb

## 2012-03-26 DIAGNOSIS — E785 Hyperlipidemia, unspecified: Secondary | ICD-10-CM

## 2012-03-26 DIAGNOSIS — Z0000 Encounter for general adult medical examination without abnormal findings: Secondary | ICD-10-CM

## 2012-03-26 MED ORDER — PRAVASTATIN SODIUM 40 MG PO TABS: 40.0000 mg | ORAL_TABLET | Freq: Every day | ORAL | Status: DC

## 2012-03-26 MED ORDER — FEXOFENADINE HCL 180 MG PO TABS: 180.0000 mg | ORAL_TABLET | Freq: Every day | ORAL | Status: DC

## 2012-03-26 NOTE — Assessment & Plan Note (Signed)

## 2012-03-26 NOTE — Patient Instructions (Addendum)
No need for change in treatment at this time Please keep your appointments with your [facility] You are given the copy of your lab work (you can take to the [place] as well) You are otherwise up to date with prevention Continue all other medications as before Your refills were done today Please call if you need an orthopedic referral for the ankles Please return in 1 year for your yearly visit, or sooner if needed, with Lab testing done 3-5 days before

## 2012-03-31 ENCOUNTER — Encounter: Payer: Self-pay | Admitting: Internal Medicine

## 2012-03-31 NOTE — Progress Notes (Signed)
Subjective:    Patient ID: Manuel Berger, male    DOB: 1956/06/01, 57 y.o.   MRN: [id number]  HPI  Here for wellness and f/u;  Overall doing ok;  Pt denies CP, worsening SOB, DOE, wheezing, orthopnea, PND, worsening LE edema, palpitations, dizziness or syncope.  Pt denies neurological change such as new Headache, facial or extremity weakness.  Pt denies polydipsia, polyuria, or low sugar symptoms. Pt states overall good compliance with treatment and medications, good tolerability, and trying to follow lower cholesterol diet.  Pt denies worsening depressive symptoms, suicidal ideation or panic. No fever, wt loss, night sweats, loss of appetite, or other constitutional symptoms.  Pt states good ability with ADL's, low fall risk, home safety reviewed and adequate, no significant changes in hearing or vision, and occasionally active with exercise. No acute complaints though has ongoing knee pain for which he has been seen at the [place], and now bilat ankle pains as well without swelling, giveaway or falls or injury, fever. Past Medical History  Diagnosis Date  . HEPATITIS C 12/05/2007  . GLUCOSE INTOLERANCE 12/05/2007  . HYPERLIPIDEMIA 12/05/2007  . ALLERGIC RHINITIS 12/05/2007  . OSTEOARTHRITIS, KNEES, BILATERAL 04/30/2008  . KNEE PAIN, BILATERAL 03/09/2010  . BURSITIS, LEFT HIP 04/30/2008  . Impaired glucose tolerance 03/23/2011   No past surgical history on file.  reports that he has never smoked. He does not have any smokeless tobacco history on file. He reports that he does not drink alcohol. His drug history not on file. family history includes COPD in his father; Diabetes in his mother; Heart disease in his father; and Hypertension in his mother. No Known Allergies Current Outpatient Prescriptions on File Prior to Visit  Medication Sig Dispense Refill  . aspirin 81 MG EC tablet Take 81 mg by mouth daily.        . fexofenadine (ALLEGRA) 180 MG tablet Take 1 tablet (180 mg total) by mouth daily.  90  tablet  3  . pravastatin (PRAVACHOL) 40 MG tablet Take 1 tablet (40 mg total) by mouth daily.  90 tablet  3   Review of Systems Review of Systems  Constitutional: Negative for diaphoresis, activity change, appetite change and unexpected weight change.  HENT: Negative for hearing loss, ear pain, facial swelling, mouth sores and neck stiffness.   Eyes: Negative for pain, redness and visual disturbance.  Respiratory: Negative for shortness of breath and wheezing.   Cardiovascular: Negative for chest pain and palpitations.  Gastrointestinal: Negative for diarrhea, blood in stool, abdominal distention and rectal pain.  Genitourinary: Negative for hematuria, flank pain and decreased urine volume.  Musculoskeletal: Negative for myalgias and joint swelling.  Skin: Negative for color change and wound.  Neurological: Negative for syncope and numbness.  Hematological: Negative for adenopathy.  Psychiatric/Behavioral: Negative for hallucinations, self-injury, decreased concentration and agitation.      Objective:   Physical Exam BP 112/84  Pulse 66  Temp(Src) 97.7 F (36.5 C) (Oral)  Ht 5\' 6"  (1.676 m)  Wt 183 lb (83.008 kg)  BMI 29.54 kg/m2  SpO2 98% Physical Exam  VS noted Constitutional: Pt is oriented to person, place, and time. Appears well-developed and well-nourished.  HENT:  Head: Normocephalic and atraumatic.  Right Ear: External ear normal.  Left Ear: External ear normal.  Nose: Nose normal.  Mouth/Throat: Oropharynx is clear and moist.  Eyes: Conjunctivae and EOM are normal. Pupils are equal, round, and reactive to light.  Neck: Normal range of motion. Neck supple. No JVD present.  No tracheal deviation present.  Cardiovascular: Normal rate, regular rhythm, normal heart sounds and intact distal pulses.   Pulmonary/Chest: Effort normal and breath sounds normal.  Abdominal: Soft. Bowel sounds are normal. There is no tenderness.  Musculoskeletal: Normal range of motion. Exhibits  no edema.  Lymphadenopathy:  Has no cervical adenopathy.  Neurological: Pt is alert and oriented to person, place, and time. Pt has normal reflexes. No cranial nerve deficit.  Skin: Skin is warm and dry. No rash noted.  Psychiatric:  Has  normal mood and affect. Behavior is normal.  Bilat knees with mild crepitus, no ankle swelling , NT and FROM    Assessment & Plan:

## 2012-03-31 NOTE — Assessment & Plan Note (Signed)
stable overall by hx and exam, most recent data reviewed with pt, and pt to continue medical treatment as before Lab Results  Component Value Date   LDLCALC 84 03/16/2012

## 2012-04-26 ENCOUNTER — Other Ambulatory Visit: Payer: Self-pay | Admitting: Internal Medicine

## 2012-11-08 ENCOUNTER — Encounter (HOSPITAL_COMMUNITY): Payer: Self-pay

## 2012-11-08 ENCOUNTER — Emergency Department (HOSPITAL_COMMUNITY)
Admission: EM | Admit: 2012-11-08 | Discharge: 2012-11-08 | Disposition: A | Discharge status: Home / Self Care | Payer: 59 | Source: Home / Self Care

## 2012-11-08 DIAGNOSIS — R197 Diarrhea, unspecified: Secondary | ICD-10-CM

## 2012-11-08 DIAGNOSIS — A084 Viral intestinal infection, unspecified: Secondary | ICD-10-CM

## 2012-11-08 DIAGNOSIS — A088 Other specified intestinal infections: Secondary | ICD-10-CM

## 2012-11-08 NOTE — ED Notes (Addendum)
C/o sick since 12-24 , with diarrhea; wife sick since 12-21 w same syx

## 2012-11-08 NOTE — ED Provider Notes (Signed)
History     CSN: [id number]  Arrival date & time 11/08/12  1029   First MD Initiated Contact with Patient 11/08/12 1048      Chief Complaint  Patient presents with  . Diarrhea    (Consider location/radiation/quality/duration/timing/severity/associated sxs/prior treatment) HPI Comments: 56 year old male who complains of diarrhea and cramps for 48 hours. His wife develop similar symptoms a couple days prior to him. He is complaining of loose watery stools and today has had 4-5 of the stools. He denies seeing any blood. No vomiting, no fever and no upper respiratory symptoms. He has been taking Pepto-Bismol and Kaopectate without relief. He does not complain of dizziness or impending syncope. Is also afebrile.   Past Medical History  Diagnosis Date  . HEPATITIS C 12/05/2007  . GLUCOSE INTOLERANCE 12/05/2007  . HYPERLIPIDEMIA 12/05/2007  . ALLERGIC RHINITIS 12/05/2007  . OSTEOARTHRITIS, KNEES, BILATERAL 04/30/2008  . KNEE PAIN, BILATERAL 03/09/2010  . BURSITIS, LEFT HIP 04/30/2008  . Impaired glucose tolerance 03/23/2011    History reviewed. No pertinent past surgical history.  Family History  Problem Relation Age of Onset  . Diabetes Mother   . Hypertension Mother   . COPD Father   . Heart disease Father     CHF    History  Substance Use Topics  . Smoking status: Never Smoker   . Smokeless tobacco: Not on file  . Alcohol Use: No      Review of Systems  Constitutional: Positive for activity change and appetite change. Negative for fever.  HENT: Negative.   Respiratory: Negative.   Cardiovascular: Negative.   Gastrointestinal:       As per history of present illness  Genitourinary: Negative.   Neurological: Negative.   Psychiatric/Behavioral: Negative.     Allergies  Review of patient's allergies indicates no known allergies.  Home Medications   Current Outpatient Rx  Name  Route  Sig  Dispense  Refill  . ASPIRIN 81 MG PO TBEC   Oral   Take 81 mg by mouth  daily.           Marland Kitchen FEXOFENADINE HCL 180 MG PO TABS   Oral   Take 1 tablet (180 mg total) by mouth daily.   90 tablet   3   . PRAVASTATIN SODIUM 40 MG PO TABS   Oral   Take 1 tablet (40 mg total) by mouth daily.   90 tablet   3   . PRAVASTATIN SODIUM 40 MG PO TABS      TAKE 1 TABLET BY MOUTH EVERY DAY   90 tablet   3     BP 131/87  Pulse 57  Temp 98.1 F (36.7 C) (Oral)  Resp 16  SpO2 97%  Physical Exam  Nursing note and vitals reviewed. Constitutional: He is oriented to person, place, and time. He appears well-developed and well-nourished. No distress.       Is sitting upright on the table in no acute distress. Speech is energetic and strong cognitively intact good muscle tone does not appear toxic or acutely ill.  Eyes: Conjunctivae normal and EOM are normal.  Neck: Normal range of motion. Neck supple.  Cardiovascular: Normal rate, regular rhythm and normal heart sounds.   Pulmonary/Chest: Effort normal and breath sounds normal. No respiratory distress. He has no wheezes. He has no rales.  Abdominal: Soft. He exhibits no distension and no mass. There is no tenderness. There is no rebound and no guarding.  Musculoskeletal: Normal range of motion. He  exhibits no edema.  Lymphadenopathy:    He has no cervical adenopathy.  Neurological: He is alert and oriented to person, place, and time. He exhibits normal muscle tone.  Skin: Skin is warm and dry. No rash noted.  Psychiatric: He has a normal mood and affect. His behavior is normal.    ED Course  Procedures (including critical care time)  Labs Reviewed - No data to display No results found.   1. Acute diarrhea   2. Viral enteritis       MDM  This patient developed symptoms shortly after his wife and he has not been on any antibiotics. His vital signs are stable he does not feel dizzy or impending syncope. He also denies nausea vomiting so he is able to hold down fluids. He also has no fever. This most  likely represents a viral enteritis and another consideration would be norovirus since there have been cases in surrounding counties. He will be treated with Imodium AD over-the-counter to check for slow down the diarrhea but does not stop the diarrhea. The plan is to decrease the number of stools per day. He is to obtain Pedialyte and drink plenty  to replenish fluids and electrolytes. This should run its course in 3-4 days and he should start feeling better sitting. He may followup with his doctor or may return.         Hayden Rasmussen, NP 11/08/12 [phone]

## 2012-11-08 NOTE — ED Provider Notes (Signed)
Medical screening examination/treatment/procedure(s) were performed by non-physician practitioner and as supervising physician I was immediately available for consultation/collaboration.  Angell Honse, M.D.   Chawn Spraggins C Kavon Valenza, MD 11/08/12 2209 

## 2013-04-27 ENCOUNTER — Other Ambulatory Visit: Payer: Self-pay | Admitting: Internal Medicine

## 2013-06-28 ENCOUNTER — Other Ambulatory Visit: Payer: Self-pay | Admitting: Internal Medicine

## 2013-06-28 ENCOUNTER — Telehealth: Payer: Self-pay

## 2013-06-28 DIAGNOSIS — Z0000 Encounter for general adult medical examination without abnormal findings: Secondary | ICD-10-CM

## 2013-06-28 NOTE — Telephone Encounter (Signed)
CPX labs entered  

## 2013-07-17 ENCOUNTER — Other Ambulatory Visit (INDEPENDENT_AMBULATORY_CARE_PROVIDER_SITE_OTHER): Payer: 59

## 2013-07-17 DIAGNOSIS — Z0000 Encounter for general adult medical examination without abnormal findings: Secondary | ICD-10-CM

## 2013-07-17 LAB — CBC WITH DIFFERENTIAL/PLATELET
Basophils Absolute: 0.1 10*3/uL (ref 0.0–0.1)
Basophils Relative: 1.1 % (ref 0.0–3.0)
Eosinophils Absolute: 0.8 10*3/uL — ABNORMAL HIGH (ref 0.0–0.7)
Hemoglobin: 13.6 g/dL (ref 13.0–17.0)
MCHC: 33.7 g/dL (ref 30.0–36.0)
MCV: 92 fl (ref 78.0–100.0)
Monocytes Absolute: 0.6 10*3/uL (ref 0.1–1.0)
Neutro Abs: 2.1 10*3/uL (ref 1.4–7.7)
Neutrophils Relative %: 34.2 % — ABNORMAL LOW (ref 43.0–77.0)
RBC: 4.38 Mil/uL (ref 4.22–5.81)
RDW: 13.7 % (ref 11.5–14.6)

## 2013-07-17 LAB — LIPID PANEL
LDL Cholesterol: 102 mg/dL — ABNORMAL HIGH (ref 0–99)
Total CHOL/HDL Ratio: 3
Triglycerides: 64 mg/dL (ref 0.0–149.0)

## 2013-07-17 LAB — URINALYSIS, ROUTINE W REFLEX MICROSCOPIC
Bilirubin Urine: NEGATIVE
Leukocytes, UA: NEGATIVE
Nitrite: NEGATIVE
Specific Gravity, Urine: 1.025 (ref 1.000–1.030)
pH: 6 (ref 5.0–8.0)

## 2013-07-17 LAB — BASIC METABOLIC PANEL
BUN: 12 mg/dL (ref 6–23)
Creatinine, Ser: 0.9 mg/dL (ref 0.4–1.5)
GFR: 107.16 mL/min (ref >60.00)

## 2013-07-17 LAB — TSH: TSH: 3.81 u[IU]/mL (ref 0.35–5.50)

## 2013-07-17 LAB — HEPATIC FUNCTION PANEL: Total Bilirubin: 0.7 mg/dL (ref 0.3–1.2)

## 2013-07-25 ENCOUNTER — Encounter: Payer: Self-pay | Admitting: Internal Medicine

## 2013-07-25 ENCOUNTER — Ambulatory Visit (INDEPENDENT_AMBULATORY_CARE_PROVIDER_SITE_OTHER): Payer: 59 | Admitting: Internal Medicine

## 2013-07-25 VITALS — BP 128/80 | HR 73 | Temp 99.1°F | Ht 66.0 in | Wt 182.2 lb

## 2013-07-25 DIAGNOSIS — Z0000 Encounter for general adult medical examination without abnormal findings: Secondary | ICD-10-CM

## 2013-07-25 DIAGNOSIS — IMO0002 Reserved for concepts with insufficient information to code with codable children: Secondary | ICD-10-CM

## 2013-07-25 DIAGNOSIS — M545 Low back pain, unspecified: Secondary | ICD-10-CM | POA: Insufficient documentation

## 2013-07-25 DIAGNOSIS — M1710 Unilateral primary osteoarthritis, unspecified knee: Secondary | ICD-10-CM

## 2013-07-25 DIAGNOSIS — Z23 Encounter for immunization: Secondary | ICD-10-CM

## 2013-07-25 MED ORDER — TRAMADOL HCL 50 MG PO TABS: 50.0000 mg | ORAL_TABLET | Freq: Four times a day (QID) | ORAL | Status: DC | PRN

## 2013-07-25 MED ORDER — PREDNISONE 10 MG PO TABS: ORAL_TABLET | ORAL | Status: DC

## 2013-07-25 NOTE — Progress Notes (Signed)
Subjective:    Patient ID: Manuel Berger, male    DOB: 12/23/1955, 57 y.o.   MRN: [id number]  HPI Here for wellness and f/u;  Overall doing ok;  Pt denies CP, worsening SOB, DOE, wheezing, orthopnea, PND, worsening LE edema, palpitations, dizziness or syncope.  Pt denies neurological change such as new headache, facial or extremity weakness.  Pt denies polydipsia, polyuria, or low sugar symptoms. Pt states overall good compliance with treatment and medications, good tolerability, and has been trying to follow lower cholesterol diet.  Pt denies worsening depressive symptoms, suicidal ideation or panic. No fever, night sweats, wt loss, loss of appetite, or other constitutional symptoms.  Pt states good ability with ADL's, has low fall risk, home safety reviewed and adequate, no other significant changes in hearing or vision, and only occasionally active with exercise.  Has hx of dislocated hip in the [employer].  Pt c/o 1 mo mod left LBP without bowel or bladder change, fever, wt loss,  gait change or falls, but with left > right weakness, numbness and pain radiating,  Complicating is bilat knee pain also incresaed in the past month, s/p cortisone in the past, mild to mod, with intermittent swelling Past Medical History  Diagnosis Date  . HEPATITIS C 12/05/2007  . GLUCOSE INTOLERANCE 12/05/2007  . HYPERLIPIDEMIA 12/05/2007  . ALLERGIC RHINITIS 12/05/2007  . OSTEOARTHRITIS, KNEES, BILATERAL 04/30/2008  . KNEE PAIN, BILATERAL 03/09/2010  . BURSITIS, LEFT HIP 04/30/2008  . Impaired glucose tolerance 03/23/2011   No past surgical history on file.  reports that he has never smoked. He does not have any smokeless tobacco history on file. He reports that he does not drink alcohol. His drug history is not on file. family history includes COPD in his father; Diabetes in his mother; Heart disease in his father; Hypertension in his mother. No Known Allergies Current Outpatient Prescriptions on File Prior to Visit   Medication Sig Dispense Refill  . aspirin 81 MG EC tablet Take 81 mg by mouth daily.        . fexofenadine (ALLEGRA) 180 MG tablet Take 1 tablet (180 mg total) by mouth daily.  90 tablet  3  . pravastatin (PRAVACHOL) 40 MG tablet Take 1 tablet (40 mg total) by mouth daily.  90 tablet  3  . pravastatin (PRAVACHOL) 40 MG tablet TAKE 1 TABLET BY MOUTH EVERY DAY  30 tablet  0   No current facility-administered medications on file prior to visit.   Review of Systems Constitutional: Negative for diaphoresis, activity change, appetite change or unexpected weight change.  HENT: Negative for hearing loss, ear pain, facial swelling, mouth sores and neck stiffness.   Eyes: Negative for pain, redness and visual disturbance.  Respiratory: Negative for shortness of breath and wheezing.   Cardiovascular: Negative for chest pain and palpitations.  Gastrointestinal: Negative for diarrhea, blood in stool, abdominal distention or other pain Genitourinary: Negative for hematuria, flank pain or change in urine volume.  Musculoskeletal: Negative for myalgias and joint swelling.  Skin: Negative for color change and wound.  Neurological: Negative for syncope and numbness. other than noted Hematological: Negative for adenopathy.  Psychiatric/Behavioral: Negative for hallucinations, self-injury, decreased concentration and agitation.      Objective:   Physical Exam BP 128/80  Pulse 73  Temp(Src) 99.1 F (37.3 C) (Oral)  Ht 5\' 6"  (1.676 m)  Wt 182 lb 4 oz (82.668 kg)  BMI 29.43 kg/m2  SpO2 98% VS noted,  Constitutional: Pt is oriented to person,  place, and time. Appears well-developed and well-nourished.  Head: Normocephalic and atraumatic.  Right Ear: External ear normal.  Left Ear: External ear normal.  Nose: Nose normal.  Mouth/Throat: Oropharynx is clear and moist.  Eyes: Conjunctivae and EOM are normal. Pupils are equal, round, and reactive to light.  Neck: Normal range of motion. Neck supple.  No JVD present. No tracheal deviation present.  Cardiovascular: Normal rate, regular rhythm, normal heart sounds and intact distal pulses.   Pulmonary/Chest: Effort normal and breath sounds normal.  Abdominal: Soft. Bowel sounds are normal. There is no tenderness. No HSM  Musculoskeletal: Normal range of motion. Exhibits no edema.  Lymphadenopathy:  Has no cervical adenopathy.  Neurological: Pt is alert and oriented to person, place, and time. Pt has normal reflexes. No cranial nerve deficit. , motor 4+ 5 LLE, sens intact, dtrs symmetric Skin: Skin is warm and dry. No rash noted. No LE edema Bilat knees with bony degen change, no effusion, FROM  Psychiatric:  Has  normal mood and affect. Behavior is normal.     Assessment & Plan:

## 2013-07-25 NOTE — Patient Instructions (Signed)
Please take all new medication as prescribed - the pain medication, and prednisone Please continue all other medications as before, and refills have been done if requested. Please have the pharmacy call with any other refills you may need. You had the flu shot today You will be contacted regarding the referral for: orthopedic for the knees and the left side pain Please continue your efforts at being more active, low cholesterol diet, and weight control. You are otherwise up to date with prevention measures today.  Please remember to sign up for My Chart if you have not done so, as this will be important to you in the future with finding out test results, communicating by private email, and scheduling acute appointments online when needed.  Please return in 1 year for your yearly visit, or sooner if needed, with Lab testing done 3-5 days before

## 2013-07-25 NOTE — Assessment & Plan Note (Signed)
Somewhat difficult due to the presence of bilat knee pain, but suspect has left sciatica as well, also for ortho referral

## 2013-07-25 NOTE — Assessment & Plan Note (Signed)

## 2013-08-25 ENCOUNTER — Other Ambulatory Visit: Payer: Self-pay | Admitting: Internal Medicine

## 2014-06-24 ENCOUNTER — Other Ambulatory Visit: Payer: Self-pay | Admitting: Internal Medicine

## 2014-09-19 ENCOUNTER — Ambulatory Visit: Payer: 59

## 2014-09-19 ENCOUNTER — Ambulatory Visit (INDEPENDENT_AMBULATORY_CARE_PROVIDER_SITE_OTHER): Payer: 59 | Admitting: *Deleted

## 2014-09-19 DIAGNOSIS — Z23 Encounter for immunization: Secondary | ICD-10-CM

## 2014-10-28 ENCOUNTER — Other Ambulatory Visit (INDEPENDENT_AMBULATORY_CARE_PROVIDER_SITE_OTHER): Payer: 59

## 2014-10-28 ENCOUNTER — Telehealth: Payer: Self-pay

## 2014-10-28 DIAGNOSIS — Z0000 Encounter for general adult medical examination without abnormal findings: Secondary | ICD-10-CM

## 2014-10-28 LAB — CBC WITH DIFFERENTIAL/PLATELET
BASOS ABS: 0 10*3/uL (ref 0.0–0.1)
Basophils Relative: 0.7 % (ref 0.0–3.0)
EOS ABS: 0.5 10*3/uL (ref 0.0–0.7)
Eosinophils Relative: 9.1 % — ABNORMAL HIGH (ref 0.0–5.0)
HEMATOCRIT: 41.9 % (ref 39.0–52.0)
Hemoglobin: 13.5 g/dL (ref 13.0–17.0)
LYMPHS ABS: 2.4 10*3/uL (ref 0.7–4.0)
Lymphocytes Relative: 39.9 % (ref 12.0–46.0)
MCHC: 32.3 g/dL (ref 30.0–36.0)
MCV: 93.7 fl (ref 78.0–100.0)
Monocytes Absolute: 0.7 10*3/uL (ref 0.1–1.0)
Monocytes Relative: 12.1 % — ABNORMAL HIGH (ref 3.0–12.0)
Neutro Abs: 2.3 10*3/uL (ref 1.4–7.7)
Neutrophils Relative %: 38.2 % — ABNORMAL LOW (ref 43.0–77.0)
PLATELETS: 177 10*3/uL (ref 150.0–400.0)
RBC: 4.47 Mil/uL (ref 4.22–5.81)
RDW: 13.6 % (ref 11.5–15.5)
WBC: 6 10*3/uL (ref 4.0–10.5)

## 2014-10-28 LAB — URINALYSIS, ROUTINE W REFLEX MICROSCOPIC
Bilirubin Urine: NEGATIVE
HGB URINE DIPSTICK: NEGATIVE
KETONES UR: NEGATIVE
Leukocytes, UA: NEGATIVE
Nitrite: NEGATIVE
RBC / HPF: NONE SEEN (ref 0–?)
Specific Gravity, Urine: 1.02 (ref 1.000–1.030)
Total Protein, Urine: NEGATIVE
UROBILINOGEN UA: 0.2 (ref 0.0–1.0)
Urine Glucose: NEGATIVE
WBC UA: NONE SEEN (ref 0–?)
pH: 6 (ref 5.0–8.0)

## 2014-10-28 LAB — LIPID PANEL
CHOL/HDL RATIO: 3
Cholesterol: 157 mg/dL (ref 0–200)
HDL: 51.4 mg/dL (ref >39.00)
LDL Cholesterol: 92 mg/dL (ref 0–99)
NONHDL: 105.6
Triglycerides: 67 mg/dL (ref 0.0–149.0)
VLDL: 13.4 mg/dL (ref 0.0–40.0)

## 2014-10-28 LAB — BASIC METABOLIC PANEL
BUN: 11 mg/dL (ref 6–23)
CO2: 27 mEq/L (ref 19–32)
Calcium: 8.9 mg/dL (ref 8.4–10.5)
Chloride: 107 mEq/L (ref 96–112)
Creatinine, Ser: 1 mg/dL (ref 0.4–1.5)
GFR: 102.85 mL/min (ref >60.00)
GLUCOSE: 108 mg/dL — AB (ref 70–99)
Potassium: 4.5 mEq/L (ref 3.5–5.1)
SODIUM: 138 meq/L (ref 135–145)

## 2014-10-28 LAB — PSA: PSA: 0.93 ng/mL (ref 0.10–4.00)

## 2014-10-28 LAB — TSH: TSH: 2.97 u[IU]/mL (ref 0.35–4.50)

## 2014-10-28 NOTE — Telephone Encounter (Signed)
Labs entered.

## 2014-10-30 ENCOUNTER — Encounter: Payer: Self-pay | Admitting: Internal Medicine

## 2014-10-30 ENCOUNTER — Ambulatory Visit (INDEPENDENT_AMBULATORY_CARE_PROVIDER_SITE_OTHER): Payer: 59 | Admitting: Internal Medicine

## 2014-10-30 VITALS — BP 130/88 | HR 62 | Temp 98.3°F | Ht 66.0 in | Wt 183.0 lb

## 2014-10-30 DIAGNOSIS — Z0000 Encounter for general adult medical examination without abnormal findings: Secondary | ICD-10-CM

## 2014-10-30 DIAGNOSIS — M25561 Pain in right knee: Secondary | ICD-10-CM

## 2014-10-30 DIAGNOSIS — M25562 Pain in left knee: Secondary | ICD-10-CM

## 2014-10-30 MED ORDER — MELOXICAM 15 MG PO TABS: 15.0000 mg | ORAL_TABLET | Freq: Every day | ORAL | Status: DC

## 2014-10-30 MED ORDER — PRAVASTATIN SODIUM 40 MG PO TABS: 40.0000 mg | ORAL_TABLET | Freq: Every day | ORAL | Status: DC

## 2014-10-30 NOTE — Addendum Note (Signed)
Addended by: Corwin LevinsJOHN, JAMES W on: 10/30/2014 01:07 PM   Modules accepted: Kipp BroodSmartSet

## 2014-10-30 NOTE — Assessment & Plan Note (Signed)
C/w deg change most likely ,,but cant r/o other such as chronic meniscus tear, for sport med referal, also mobic 15 qd prn

## 2014-10-30 NOTE — Patient Instructions (Addendum)
Your EKG and lab work was OK today  Please take all new medication as prescribed - the anti-inflammatory  You will be contacted regarding the referral for: Dr Smith/sport medicine  Please continue all other medications as before, and refills have been done if requested.  Please have the pharmacy call with any other refills you may need.  Please continue your efforts at being more active, low cholesterol diet, and weight control.  You are otherwise up to date with prevention measures today.  Please keep your appointments with your specialists as you may have planned  Please return in 1 year for your yearly visit, or sooner if needed, with Lab testing done 3-5 days before

## 2014-10-30 NOTE — Progress Notes (Signed)
Subjective:    Patient ID: Manuel Berger, male    DOB: 04-Oct-1956, 58 y.o.   MRN: [id number]  HPI  Here for wellness and f/u;  Overall doing ok;  Pt denies CP, worsening SOB, DOE, wheezing, orthopnea, PND, worsening LE edema, palpitations, dizziness or syncope.  Pt denies neurological change such as new headache, facial or extremity weakness.  Pt denies polydipsia, polyuria, or low sugar symptoms. Pt states overall good compliance with treatment and medications, good tolerability, and has been trying to follow lower cholesterol diet.  Pt denies worsening depressive symptoms, suicidal ideation or panic. No fever, night sweats, wt loss, loss of appetite, or other constitutional symptoms.  Pt states good ability with ADL's, has low fall risk, home safety reviewed and adequate, no other significant changes in hearing or vision, and only occasionally active with exercise.  Has had several episodes of right > left knee pain, intermittent swelling, right tends to want to giveaway but no falls.  Long hx of knee problem since [employer] [place].No other complaints. Does have nocturia x 1 most nights but no hesitancy or slow flow Past Medical History  Diagnosis Date  . HEPATITIS C 12/05/2007  . GLUCOSE INTOLERANCE 12/05/2007  . HYPERLIPIDEMIA 12/05/2007  . ALLERGIC RHINITIS 12/05/2007  . OSTEOARTHRITIS, KNEES, BILATERAL 04/30/2008  . KNEE PAIN, BILATERAL 03/09/2010  . BURSITIS, LEFT HIP 04/30/2008  . Impaired glucose tolerance 03/23/2011   No past surgical history on file.  reports that he has never smoked. He does not have any smokeless tobacco history on file. He reports that he does not drink alcohol. His drug history is not on file. family history includes COPD in his father; Diabetes in his mother; Heart disease in his father; Hypertension in his mother. No Known Allergies Current Outpatient Prescriptions on File Prior to Visit  Medication Sig Dispense Refill  . aspirin 81 MG EC tablet  Take 81 mg by mouth daily.      . fexofenadine (ALLEGRA) 180 MG tablet Take 1 tablet (180 mg total) by mouth daily. 90 tablet 3  . pravastatin (PRAVACHOL) 40 MG tablet Take 1 tablet (40 mg total) by mouth daily. 90 tablet 3  . pravastatin (PRAVACHOL) 40 MG tablet TAKE 1 TABLET BY MOUTH EVERY DAY 30 tablet 0  . pravastatin (PRAVACHOL) 40 MG tablet TAKE 1 TABLET BY MOUTH EVERY DAY 30 tablet 5  . predniSONE (DELTASONE) 10 MG tablet 3 tabs by mouth per day for 3 days,2tabs per day for 3 days,1tab per day for 3 days 18 tablet 0  . traMADol (ULTRAM) 50 MG tablet Take 1 tablet (50 mg total) by mouth every 6 (six) hours as needed for pain. 60 tablet 1   No current facility-administered medications on file prior to visit.   Review of Systems Constitutional: Negative for increased diaphoresis, other activity, appetite or other siginficant weight change  HENT: Negative for worsening hearing loss, ear pain, facial swelling, mouth sores and neck stiffness.   Eyes: Negative for other worsening pain, redness or visual disturbance.  Respiratory: Negative for shortness of breath and wheezing.   Cardiovascular: Negative for chest pain and palpitations.  Gastrointestinal: Negative for diarrhea, blood in stool, abdominal distention or other pain Genitourinary: Negative for hematuria, flank pain or change in urine volume.  Musculoskeletal: Negative for myalgias or other joint complaints.  Skin: Negative for color change and wound.  Neurological: Negative for syncope and numbness. other than noted Hematological: Negative for adenopathy. or other swelling Psychiatric/Behavioral: Negative for  hallucinations, self-injury, decreased concentration or other worsening agitation.      Objective:   Physical Exam BP 130/88 mmHg  Pulse 62  Temp(Src) 98.3 F (36.8 C) (Oral)  Ht 5\' 6"  (1.676 m)  Wt 183 lb (83.008 kg)  BMI 29.55 kg/m2  SpO2 98% VS noted,  Constitutional: Pt is oriented to person, place, and time.  Appears well-developed and well-nourished.  Head: Normocephalic and atraumatic.  Right Ear: External ear normal.  Left Ear: External ear normal.  Nose: Nose normal.  Mouth/Throat: Oropharynx is clear and moist.  Eyes: Conjunctivae and EOM are normal. Pupils are equal, round, and reactive to light.  Neck: Normal range of motion. Neck supple. No JVD present. No tracheal deviation present.  Cardiovascular: Normal rate, regular rhythm, normal heart sounds and intact distal pulses.   Pulmonary/Chest: Effort normal and breath sounds without rales or wheezing  Abdominal: Soft. Bowel sounds are normal. NT. No HSM  Musculoskeletal: Normal range of motion. Exhibits no edema.  Lymphadenopathy:  Has no cervical adenopathy.  Neurological: Pt is alert and oriented to person, place, and time. Pt has normal reflexes. No cranial nerve deficit. Motor grossly intact Skin: Skin is warm and dry. No rash noted.  Psychiatric:  Has normal mood and affect. Behavior is normal.  Bilat knee without effusion, but with crepitus    Assessment & Plan:

## 2014-10-30 NOTE — Assessment & Plan Note (Addendum)

## 2014-10-30 NOTE — Progress Notes (Signed)
Pre visit review using our clinic review tool, if applicable. No additional management support is needed unless otherwise documented below in the visit note. 

## 2014-11-26 ENCOUNTER — Ambulatory Visit: Payer: 59 | Admitting: Family Medicine

## 2014-12-26 ENCOUNTER — Ambulatory Visit: Payer: Self-pay | Admitting: Family Medicine

## 2015-08-31 ENCOUNTER — Encounter: Payer: Self-pay | Admitting: Gastroenterology

## 2015-09-06 ENCOUNTER — Encounter (HOSPITAL_COMMUNITY): Payer: Self-pay | Admitting: Vascular Surgery

## 2015-09-06 ENCOUNTER — Emergency Department (HOSPITAL_COMMUNITY)
Admission: EM | Admit: 2015-09-06 | Discharge: 2015-09-06 | Disposition: A | Discharge status: Home / Self Care | Payer: 59 | Attending: Emergency Medicine | Admitting: Emergency Medicine

## 2015-09-06 ENCOUNTER — Emergency Department (HOSPITAL_COMMUNITY): Payer: 59

## 2015-09-06 DIAGNOSIS — Z8619 Personal history of other infectious and parasitic diseases: Secondary | ICD-10-CM | POA: Diagnosis not present

## 2015-09-06 DIAGNOSIS — R42 Dizziness and giddiness: Secondary | ICD-10-CM

## 2015-09-06 DIAGNOSIS — Z7982 Long term (current) use of aspirin: Secondary | ICD-10-CM | POA: Insufficient documentation

## 2015-09-06 DIAGNOSIS — E785 Hyperlipidemia, unspecified: Secondary | ICD-10-CM | POA: Insufficient documentation

## 2015-09-06 LAB — COMPREHENSIVE METABOLIC PANEL
ALT: 75 U/L — ABNORMAL HIGH (ref 17–63)
AST: 54 U/L — ABNORMAL HIGH (ref 15–41)
Albumin: 3.5 g/dL (ref 3.5–5.0)
Alkaline Phosphatase: 61 U/L (ref 38–126)
Anion gap: 6 (ref 5–15)
BUN: 11 mg/dL (ref 6–20)
CO2: 29 mmol/L (ref 22–32)
Calcium: 9.3 mg/dL (ref 8.9–10.3)
Chloride: 103 mmol/L (ref 101–111)
Creatinine, Ser: 0.96 mg/dL (ref 0.61–1.24)
GFR calc Af Amer: >60 mL/min (ref >60)
GFR calc non Af Amer: >60 mL/min (ref >60)
Glucose, Bld: 136 mg/dL — ABNORMAL HIGH (ref 65–99)
Potassium: 4.4 mmol/L (ref 3.5–5.1)
Sodium: 138 mmol/L (ref 135–145)
Total Bilirubin: 0.6 mg/dL (ref 0.3–1.2)
Total Protein: 8.4 g/dL — ABNORMAL HIGH (ref 6.5–8.1)

## 2015-09-06 LAB — CBC
HCT: 42.5 % (ref 39.0–52.0)
Hemoglobin: 14.3 g/dL (ref 13.0–17.0)
MCH: 30.6 pg (ref 26.0–34.0)
MCHC: 33.6 g/dL (ref 30.0–36.0)
MCV: 90.8 fL (ref 78.0–100.0)
Platelets: 205 10*3/uL (ref 150–400)
RBC: 4.68 MIL/uL (ref 4.22–5.81)
RDW: 13.2 % (ref 11.5–15.5)
WBC: 7.1 10*3/uL (ref 4.0–10.5)

## 2015-09-06 LAB — DIFFERENTIAL
Basophils Absolute: 0 10*3/uL (ref 0.0–0.1)
Basophils Relative: 0 %
Eosinophils Absolute: 0.2 10*3/uL (ref 0.0–0.7)
Eosinophils Relative: 2 %
Lymphocytes Relative: 23 %
Lymphs Abs: 1.6 10*3/uL (ref 0.7–4.0)
Monocytes Absolute: 0.3 10*3/uL (ref 0.1–1.0)
Monocytes Relative: 5 %
Neutro Abs: 5 10*3/uL (ref 1.7–7.7)
Neutrophils Relative %: 70 %

## 2015-09-06 LAB — I-STAT TROPONIN, ED: Troponin i, poc: 0.01 ng/mL (ref 0.00–0.08)

## 2015-09-06 MED ORDER — MECLIZINE HCL 25 MG PO TABS: 25.0000 mg | ORAL_TABLET | Freq: Three times a day (TID) | ORAL | Status: DC | PRN

## 2015-09-06 MED ORDER — MECLIZINE HCL 25 MG PO TABS
25.0000 mg | ORAL_TABLET | Freq: Once | ORAL | Status: AC
  Administered 2015-09-06: 25 mg via ORAL
  Filled 2015-09-06: qty 1

## 2015-09-06 MED ORDER — DIAZEPAM 5 MG PO TABS
5.0000 mg | ORAL_TABLET | Freq: Once | ORAL | Status: AC
  Administered 2015-09-06: 5 mg via ORAL
  Filled 2015-09-06: qty 1

## 2015-09-06 NOTE — ED Provider Notes (Signed)
CSN: [id number]     Arrival date & time 09/06/15  1214 History   First MD Initiated Contact with Patient 09/06/15 1245     Chief Complaint  Patient presents with  . Dizziness     (Consider location/radiation/quality/duration/timing/severity/associated sxs/prior Treatment) HPI   [age]-year-old male with vertigo. Patient describes sensation of the room spinning. Onset this morning. Went to bed in his usual state of health. First noticed as he was getting out of bed. He had to hold still for a couple minutes until he felt better. Associated with nausea. No vomiting. Intermittent symptoms throughout the morning so percent the emergency room. No acute visual complaints. No ear fullness or tinnitus. No focal weakness. Denies any acute pain anywhere. He reports he gets symptoms intermittently both at rest and with movement. No change in mental status per his significant other at bedside.  Past Medical History  Diagnosis Date  . HEPATITIS C 12/05/2007  . GLUCOSE INTOLERANCE 12/05/2007  . HYPERLIPIDEMIA 12/05/2007  . ALLERGIC RHINITIS 12/05/2007  . OSTEOARTHRITIS, KNEES, BILATERAL 04/30/2008  . KNEE PAIN, BILATERAL 03/09/2010  . BURSITIS, LEFT HIP 04/30/2008  . Impaired glucose tolerance 03/23/2011   History reviewed. No pertinent past surgical history. Family History  Problem Relation Age of Onset  . Diabetes Mother   . Hypertension Mother   . COPD Father   . Heart disease Father     CHF   Social History  Substance Use Topics  . Smoking status: Never Smoker   . Smokeless tobacco: None  . Alcohol Use: No    Review of Systems  All systems reviewed and negative, other than as noted in HPI.   Allergies  Review of patient's allergies indicates no known allergies.  Home Medications   Prior to Admission medications   Medication Sig Start Date End Date Taking? Authorizing Provider  aspirin 81 MG EC tablet Take 81 mg by mouth daily.      Historical Provider, MD  fexofenadine (ALLEGRA)  180 MG tablet Take 1 tablet (180 mg total) by mouth daily. 03/26/12   Corwin LevinsJames W John, MD  meloxicam (MOBIC) 15 MG tablet Take 1 tablet (15 mg total) by mouth daily. As needed 10/30/14   Corwin LevinsJames W John, MD  pravastatin (PRAVACHOL) 40 MG tablet Take 1 tablet (40 mg total) by mouth daily. 10/30/14   Corwin LevinsJames W John, MD   BP 125/86 mmHg  Pulse 62  Temp(Src) 97.7 F (36.5 C) (Oral)  Resp 18  SpO2 97% Physical Exam  Constitutional: He is oriented to person, place, and time. He appears well-developed and well-nourished. No distress.  HENT:  Head: Normocephalic and atraumatic.  Eyes: Conjunctivae and EOM are normal. Pupils are equal, round, and reactive to light. Right eye exhibits no discharge. Left eye exhibits no discharge.  Neck: Neck supple.  Cardiovascular: Normal rate, regular rhythm and normal heart sounds.  Exam reveals no gallop and no friction rub.   No murmur heard. Pulmonary/Chest: Effort normal and breath sounds normal. No respiratory distress.  Abdominal: Soft. He exhibits no distension. There is no tenderness.  Musculoskeletal: He exhibits no edema or tenderness.  Neurological: He is alert and oriented to person, place, and time. No cranial nerve deficit. He exhibits normal muscle tone. Coordination normal.  Good finger to nose testing bilaterally. Good heel-to-shin testing. Able to sit up unassisted. Cranial nerves II through XII were intact. Strength is 5 out of 5 bilateral upper lower extremities. Sensation is intact to light touch. No nystagmus appreciated.  Skin: Skin  is warm and dry.  Psychiatric: He has a normal mood and affect. His behavior is normal. Thought content normal.  Nursing note and vitals reviewed.   ED Course  Procedures (including critical care time) Labs Review Labs Reviewed  COMPREHENSIVE METABOLIC PANEL - Abnormal; Notable for the following:    Glucose, Bld 136 (*)    Total Protein 8.4 (*)    AST 54 (*)    ALT 75 (*)    All other components within normal  limits  CBC  DIFFERENTIAL  Rosezena Sensor, ED    Imaging Review Mr Brain Wo Contrast  09/06/2015  CLINICAL DATA:  And vertigo beginning yesterday and persisting today. EXAM: MRI HEAD WITHOUT CONTRAST TECHNIQUE: Multiplanar, multiecho pulse sequences of the brain and surrounding structures were obtained without intravenous contrast. COMPARISON:  CT head without contrast from the same day. FINDINGS: Scattered periventricular and subcortical T2 changes are advanced for age. There is no significant atrophy. The ventricles are of normal size. No significant extra-axial fluid collections are present. The internal auditory canals are within normal limits. Brainstem and cerebellum is unremarkable. Flow is present in the major intracranial arteries. The globes and orbits are intact. Mild mucosal thickening is evident throughout the paranasal sinuses. There are no fluid levels. The mastoid air cells are clear. The skullbase is within normal limits. Midline structures are unremarkable. IMPRESSION: 1. No acute intracranial abnormality. 2. Scattered periventricular and subcortical T2 changes are greater than expected for age. The finding is nonspecific but can be seen in the setting of chronic microvascular ischemia, a demyelinating process such as multiple sclerosis, vasculitis, complicated migraine headaches, or as the sequelae of a prior infectious or inflammatory process. 3. Mild diffuse sinus disease without significant fluid collections. Electronically Signed   By: Marin Roberts M.D.   On: 09/06/2015 16:30   I have personally reviewed and evaluated these images and lab results as part of my medical decision-making.   EKG Interpretation   Date/Time:  Sunday September 06 2015 12:36:28 EDT Ventricular Rate:  65 PR Interval:  156 QRS Duration: 80 QT Interval:  410 QTC Calculation: 426 R Axis:   -9 Text Interpretation:  Normal sinus rhythm Left ventricular hypertrophy T  wave abnormality, consider  inferolateral ischemia Abnormal ECG Confirmed  by Juleen China  MD, Nura Cahoon ([id number]) on 09/06/2015 12:47:45 PM      MDM   Final diagnoses:  Vertigo    [age]-year-old male with vert Plan continue symptomatic treatment. Return precautions were discussed. Not clearly positional. Workup unremarkable including neuro imaging. Less likely central cause. Symptomatically much better with meclizine.    Raeford Razor, MD 09/08/15 [phone]

## 2015-09-06 NOTE — Discharge Instructions (Signed)
Benign Positional Vertigo Vertigo is the feeling that you or your surroundings are moving when they are not. Benign positional vertigo is the most common form of vertigo. The cause of this condition is not serious (is benign). This condition is triggered by certain movements and positions (is positional). This condition can be dangerous if it occurs while you are doing something that could endanger you or others, such as driving.  CAUSES In many cases, the cause of this condition is not known. It may be caused by a disturbance in an area of the inner ear that helps your brain to sense movement and balance. This disturbance can be caused by a viral infection (labyrinthitis), head injury, or repetitive motion. RISK FACTORS This condition is more likely to develop in: 1. Women. 2. People who are 50 years of age or older. SYMPTOMS Symptoms of this condition usually happen when you move your head or your eyes in different directions. Symptoms may start suddenly, and they usually last for less than a minute. Symptoms may include:  Loss of balance and falling.  Feeling like you are spinning or moving.  Feeling like your surroundings are spinning or moving.  Nausea and vomiting.  Blurred vision.  Dizziness.  Involuntary eye movement (nystagmus). Symptoms can be mild and cause only slight annoyance, or they can be severe and interfere with daily life. Episodes of benign positional vertigo may return (recur) over time, and they may be triggered by certain movements. Symptoms may improve over time. DIAGNOSIS This condition is usually diagnosed by medical history and a physical exam of the head, neck, and ears. You may be referred to a health care provider who specializes in ear, nose, and throat (ENT) problems (otolaryngologist) or a provider who specializes in disorders of the nervous system (neurologist). You may have additional testing, including:  MRI.  A CT scan.  Eye movement tests. Your  health care provider may ask you to change positions quickly while he or she watches you for symptoms of benign positional vertigo, such as nystagmus. Eye movement may be tested with an electronystagmogram (ENG), caloric stimulation, the Dix-Hallpike test, or the roll test.  An electroencephalogram (EEG). This records electrical activity in your brain.  Hearing tests. TREATMENT Usually, your health care provider will treat this by moving your head in specific positions to adjust your inner ear back to normal. Surgery may be needed in severe cases, but this is rare. In some cases, benign positional vertigo may resolve on its own in 2-4 weeks. HOME CARE INSTRUCTIONS Safety  Move slowly.Avoid sudden body or head movements.  Avoid driving.  Avoid operating heavy machinery.  Avoid doing any tasks that would be dangerous to you or others if a vertigo episode would occur.  If you have trouble walking or keeping your balance, try using a cane for stability. If you feel dizzy or unstable, sit down right away.  Return to your normal activities as told by your health care provider. Ask your health care provider what activities are safe for you. General Instructions  Take over-the-counter and prescription medicines only as told by your health care provider.  Avoid certain positions or movements as told by your health care provider.  Drink enough fluid to keep your urine clear or pale yellow.  Keep all follow-up visits as told by your health care provider. This is important. SEEK MEDICAL CARE IF:  You have a fever.  Your condition gets worse or you develop new symptoms.  Your family or friends   notice any behavioral changes.  Your nausea or vomiting gets worse.  You have numbness or a "pins and needles" sensation. SEEK IMMEDIATE MEDICAL CARE IF:  You have difficulty speaking or moving.  You are always dizzy.  You faint.  You develop severe headaches.  You have weakness in your  legs or arms.  You have changes in your hearing or vision.  You develop a stiff neck.  You develop sensitivity to light.   This information is not intended to replace advice given to you by your health care provider. Make sure you discuss any questions you have with your health care provider.   Document Released: 08/08/2006 Document Revised: 07/22/2015 Document Reviewed: 02/23/2015 Elsevier Interactive Patient Education 2016 [employer] Epley Maneuver Self-Care WHAT IS THE EPLEY MANEUVER? The Epley maneuver is an exercise you can do to relieve symptoms of benign paroxysmal positional vertigo (BPPV). This condition is often just referred to as vertigo. BPPV is caused by the movement of tiny crystals (canaliths) inside your inner ear. The accumulation and movement of canaliths in your inner ear causes a sudden spinning sensation (vertigo) when you move your head to certain positions. Vertigo usually lasts about 30 seconds. BPPV usually occurs in just one ear. If you get vertigo when you lie on your left side, you probably have BPPV in your left ear. Your health care provider can tell you which ear is involved.  BPPV may be caused by a head injury. Many people older than 50 get BPPV for unknown reasons. If you have been diagnosed with BPPV, your health care provider may teach you how to do this maneuver. BPPV is not life threatening (benign) and usually goes away in time.  WHEN SHOULD I PERFORM THE EPLEY MANEUVER? You can do this maneuver at home whenever you have symptoms of vertigo. You may do the Epley maneuver up to 3 times a day until your symptoms of vertigo go away. HOW SHOULD I DO THE EPLEY MANEUVER? 3. Sit on the edge of a bed or table with your back straight. Your legs should be extended or hanging over the edge of the bed or table.  4. Turn your head halfway toward the affected ear.  5. Lie backward quickly with your head turned until you are lying flat on your back. You may want to  position a pillow under your shoulders.  6. Hold this position for 30 seconds. You may experience an attack of vertigo. This is normal. Hold this position until the vertigo stops. 7. Then turn your head to the opposite direction until your unaffected ear is facing the floor.  8. Hold this position for 30 seconds. You may experience an attack of vertigo. This is normal. Hold this position until the vertigo stops. 9. Now turn your whole body to the same side as your head. Hold for another 30 seconds.  10. You can then sit back up. ARE THERE RISKS TO THIS MANEUVER? In some cases, you may have other symptoms (such as changes in your vision, weakness, or numbness). If you have these symptoms, stop doing the maneuver and call your health care provider. Even if doing these maneuvers relieves your vertigo, you may still have dizziness. Dizziness is the sensation of light-headedness but without the sensation of movement. Even though the Epley maneuver may relieve your vertigo, it is possible that your symptoms will return within 5 years. WHAT SHOULD I DO AFTER THIS MANEUVER? After doing the Epley maneuver, you can return to your normal   activities. Ask your doctor if there is anything you should do at home to prevent vertigo. This may include:  Sleeping with two or more pillows to keep your head elevated.  Not sleeping on the side of your affected ear.  Getting up slowly from bed.  Avoiding sudden movements during the day.  Avoiding extreme head movement, like looking up or bending over.  Wearing a cervical collar to prevent sudden head movements. WHAT SHOULD I DO IF MY SYMPTOMS GET WORSE? Call your health care provider if your vertigo gets worse. Call your provider right way if you have other symptoms, including:   Nausea.  Vomiting.  Headache.  Weakness.  Numbness.  Vision changes.   This information is not intended to replace advice given to you by your health care provider. Make  sure you discuss any questions you have with your health care provider.   Document Released: 11/05/2013 Document Reviewed: 11/05/2013 Elsevier Interactive Patient Education 2016 Elsevier Inc[employer]  

## 2015-09-06 NOTE — ED Notes (Signed)
Pt reports to the ED for eval of dizziness that he noticed this am upon awakening. Pt reports last time he was not dizzy was last night 8 pm. Denies any numbness, tingling, paralysis, or vision changes. Also reports slight Ha that began this am. Reports he leaned over last night and when he leaned back up he was dizzy. Reports some associated nausea but denies any active vomiting. Also reports right hip pain that radiates into his groin. This has been going on x 1 month. Denies any injury to the hip. Speech clear, no facial droop, no arm drift, grips equal. Pt A&Ox4, resp e/u, and skin warm and dry.

## 2015-11-13 ENCOUNTER — Encounter: Payer: 59 | Admitting: Internal Medicine

## 2016-01-20 ENCOUNTER — Other Ambulatory Visit: Payer: 59

## 2016-01-25 ENCOUNTER — Ambulatory Visit (INDEPENDENT_AMBULATORY_CARE_PROVIDER_SITE_OTHER): Payer: 59 | Admitting: Internal Medicine

## 2016-01-25 ENCOUNTER — Encounter: Payer: Self-pay | Admitting: Internal Medicine

## 2016-01-25 ENCOUNTER — Other Ambulatory Visit (INDEPENDENT_AMBULATORY_CARE_PROVIDER_SITE_OTHER): Payer: 59

## 2016-01-25 VITALS — BP 136/82 | HR 74 | Temp 98.5°F | Resp 20 | Wt 180.0 lb

## 2016-01-25 DIAGNOSIS — Z0000 Encounter for general adult medical examination without abnormal findings: Secondary | ICD-10-CM

## 2016-01-25 DIAGNOSIS — M67431 Ganglion, right wrist: Secondary | ICD-10-CM

## 2016-01-25 DIAGNOSIS — R7302 Impaired glucose tolerance (oral): Secondary | ICD-10-CM | POA: Diagnosis not present

## 2016-01-25 DIAGNOSIS — E785 Hyperlipidemia, unspecified: Secondary | ICD-10-CM

## 2016-01-25 DIAGNOSIS — Z0001 Encounter for general adult medical examination with abnormal findings: Secondary | ICD-10-CM

## 2016-01-25 DIAGNOSIS — M67432 Ganglion, left wrist: Secondary | ICD-10-CM

## 2016-01-25 DIAGNOSIS — Z23 Encounter for immunization: Secondary | ICD-10-CM | POA: Diagnosis not present

## 2016-01-25 DIAGNOSIS — M25561 Pain in right knee: Secondary | ICD-10-CM

## 2016-01-25 DIAGNOSIS — M25562 Pain in left knee: Secondary | ICD-10-CM

## 2016-01-25 LAB — LIPID PANEL
CHOLESTEROL: 209 mg/dL — AB (ref 0–200)
HDL: 62.6 mg/dL (ref >39.00)
LDL Cholesterol: 134 mg/dL — ABNORMAL HIGH (ref 0–99)
NonHDL: 146.53
TRIGLYCERIDES: 64 mg/dL (ref 0.0–149.0)
Total CHOL/HDL Ratio: 3
VLDL: 12.8 mg/dL (ref 0.0–40.0)

## 2016-01-25 LAB — CBC WITH DIFFERENTIAL/PLATELET
BASOS ABS: 0.1 10*3/uL (ref 0.0–0.1)
BASOS PCT: 1.2 % (ref 0.0–3.0)
EOS PCT: 7.8 % — AB (ref 0.0–5.0)
Eosinophils Absolute: 0.5 10*3/uL (ref 0.0–0.7)
HCT: 42.7 % (ref 39.0–52.0)
Hemoglobin: 13.7 g/dL (ref 13.0–17.0)
LYMPHS ABS: 2.7 10*3/uL (ref 0.7–4.0)
Lymphocytes Relative: 43.5 % (ref 12.0–46.0)
MCHC: 32.2 g/dL (ref 30.0–36.0)
MCV: 90.9 fl (ref 78.0–100.0)
MONOS PCT: 9.6 % (ref 3.0–12.0)
Monocytes Absolute: 0.6 10*3/uL (ref 0.1–1.0)
NEUTROS ABS: 2.3 10*3/uL (ref 1.4–7.7)
NEUTROS PCT: 37.9 % — AB (ref 43.0–77.0)
PLATELETS: 200 10*3/uL (ref 150.0–400.0)
RBC: 4.7 Mil/uL (ref 4.22–5.81)
RDW: 14.2 % (ref 11.5–15.5)
WBC: 6.2 10*3/uL (ref 4.0–10.5)

## 2016-01-25 LAB — BASIC METABOLIC PANEL
BUN: 14 mg/dL (ref 6–23)
CHLORIDE: 106 meq/L (ref 96–112)
CO2: 27 mEq/L (ref 19–32)
Calcium: 9.5 mg/dL (ref 8.4–10.5)
Creatinine, Ser: 0.97 mg/dL (ref 0.40–1.50)
GFR: 101.2 mL/min (ref >60.00)
GLUCOSE: 106 mg/dL — AB (ref 70–99)
POTASSIUM: 4.1 meq/L (ref 3.5–5.1)
Sodium: 141 mEq/L (ref 135–145)

## 2016-01-25 LAB — HEPATIC FUNCTION PANEL
ALK PHOS: 61 U/L (ref 39–117)
ALT: 85 U/L — ABNORMAL HIGH (ref 0–53)
AST: 59 U/L — ABNORMAL HIGH (ref 0–37)
Albumin: 4.2 g/dL (ref 3.5–5.2)
BILIRUBIN DIRECT: 0.1 mg/dL (ref 0.0–0.3)
BILIRUBIN TOTAL: 0.5 mg/dL (ref 0.2–1.2)
TOTAL PROTEIN: 8.2 g/dL (ref 6.0–8.3)

## 2016-01-25 LAB — URINALYSIS, ROUTINE W REFLEX MICROSCOPIC
BILIRUBIN URINE: NEGATIVE
Hgb urine dipstick: NEGATIVE
KETONES UR: NEGATIVE
Leukocytes, UA: NEGATIVE
Nitrite: NEGATIVE
RBC / HPF: NONE SEEN (ref 0–?)
SPECIFIC GRAVITY, URINE: 1.02 (ref 1.000–1.030)
Total Protein, Urine: NEGATIVE
URINE GLUCOSE: NEGATIVE
Urobilinogen, UA: 0.2 (ref 0.0–1.0)
pH: 6 (ref 5.0–8.0)

## 2016-01-25 LAB — HEMOGLOBIN A1C: Hgb A1c MFr Bld: 6.2 % (ref 4.6–6.5)

## 2016-01-25 LAB — TSH: TSH: 1.83 u[IU]/mL (ref 0.35–4.50)

## 2016-01-25 LAB — PSA: PSA: 1.01 ng/mL (ref 0.10–4.00)

## 2016-01-25 MED ORDER — DICLOFENAC SODIUM 75 MG PO TBEC: 75.0000 mg | DELAYED_RELEASE_TABLET | Freq: Two times a day (BID) | ORAL | Status: DC

## 2016-01-25 NOTE — Patient Instructions (Addendum)
You had the flu shot today  Please take all new medication as prescribed - the anti-inflammatory  Please continue all other medications as before, and refills have been done if requested.  Please have the pharmacy call with any other refills you may need.  Please continue your efforts at being more active, low cholesterol diet, and weight control.  You are otherwise up to date with prevention measures today.  Please keep your appointments with your specialists as you may have planned  You will be contacted regarding the referral for: Dr Katrinka BlazingSmith (sports medicine) ; you could also make an appt at the scheduling desk as you leave for this as well  Please go to the LAB in the Basement (turn left off the elevator) for the tests to be done today  You will be contacted by phone if any changes need to be made immediately.  Otherwise, you will receive a letter about your results with an explanation, but please check with MyChart first.  Please remember to sign up for MyChart if you have not done so, as this will be important to you in the future with finding out test results, communicating by private email, and scheduling acute appointments online when needed.  Please return in 1 year for your yearly visit, or sooner if needed, with Lab testing done 3-5 days before

## 2016-01-25 NOTE — Progress Notes (Signed)
Pre visit review using our clinic review tool, if applicable. No additional management support is needed unless otherwise documented below in the visit note. 

## 2016-01-25 NOTE — Progress Notes (Signed)
Subjective:    Patient ID: Manuel Berger, male    DOB: Jun 24, 1956, 60 y.o.   MRN: [id number]  HPI  Here for wellness and f/u;  Overall doing ok;  Pt denies Chest pain, worsening SOB, DOE, wheezing, orthopnea, PND, worsening LE edema, palpitations, dizziness or syncope.  Pt denies neurological change such as new headache, facial or extremity weakness.  Pt denies polydipsia, polyuria, or low sugar symptoms. Pt states overall good compliance with treatment and medications, good tolerability, and has been trying to follow appropriate diet.  Pt denies worsening depressive symptoms, suicidal ideation or panic. No fever, night sweats, wt loss, loss of appetite, or other constitutional symptoms.  Pt states good ability with ADL's, has low fall risk, home safety reviewed and adequate, no other significant changes in hearing or vision, and only occasionally active with exercise. Due for flu shot.  Does have marked left knee pain, milder right knee pain with known DJD and recurrent swelling, with the left assoc with pain to the lateral left leg to the left hip area, worse for several months.  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 Past Medical History  Diagnosis Date  . HEPATITIS C 12/05/2007  . GLUCOSE INTOLERANCE 12/05/2007  . HYPERLIPIDEMIA 12/05/2007  . ALLERGIC RHINITIS 12/05/2007  . OSTEOARTHRITIS, KNEES, BILATERAL 04/30/2008  . KNEE PAIN, BILATERAL 03/09/2010  . BURSITIS, LEFT HIP 04/30/2008  . Impaired glucose tolerance 03/23/2011   No past surgical history on file.  reports that he has never smoked. He does not have any smokeless tobacco history on file. He reports that he does not drink alcohol. His drug history is not on file. family history includes COPD in his father; Diabetes in his mother; Heart disease in his father; Hypertension in his mother. No Known Allergies Current Outpatient Prescriptions on  File Prior to Visit  Medication Sig Dispense Refill  . aspirin 81 MG EC tablet Take 81 mg by mouth daily.      . fexofenadine (ALLEGRA) 180 MG tablet Take 1 tablet (180 mg total) by mouth daily. 90 tablet 3  . meclizine (ANTIVERT) 25 MG tablet Take 1 tablet (25 mg total) by mouth 3 (three) times daily as needed for dizziness. 20 tablet 0  . pravastatin (PRAVACHOL) 40 MG tablet Take 1 tablet (40 mg total) by mouth daily. 90 tablet 3   No current facility-administered medications on file prior to visit.   Review of Systems Constitutional: Negative for increased diaphoresis, other activity, appetite or siginficant weight change other than noted HENT: Negative for worsening hearing loss, ear pain, facial swelling, mouth sores and neck stiffness.   Eyes: Negative for other worsening pain, redness or visual disturbance.  Respiratory: Negative for shortness of breath and wheezing  Cardiovascular: Negative for chest pain and palpitations.  Gastrointestinal: Negative for diarrhea, blood in stool, abdominal distention or other pain Genitourinary: Negative for hematuria, flank pain or change in urine volume.  Musculoskeletal: Negative for myalgias or other joint complaints.  Skin: Negative for color change and wound or drainage.  Neurological: Negative for syncope and numbness. other than noted Hematological: Negative for adenopathy. or other swelling Psychiatric/Behavioral: Negative for hallucinations, SI, self-injury, decreased concentration or other worsening agitation.      Objective:   Physical Exam BP 136/82 mmHg  Pulse 74  Temp(Src) 98.5 F (36.9 C) (Oral)  Resp 20  Wt 180 lb (81.647 kg)  SpO2  96% VS noted,  Constitutional: Pt is oriented to person, place, and time. Appears well-developed and well-nourished, in no significant distress Head: Normocephalic and atraumatic.  Right Ear: External ear normal.  Left Ear: External ear normal.  Nose: Nose normal.  Mouth/Throat: Oropharynx is  clear and moist.  Eyes: Conjunctivae and EOM are normal. Pupils are equal, round, and reactive to light.  Neck: Normal range of motion. Neck supple. No JVD present. No tracheal deviation present or significant neck LA or mass Cardiovascular: Normal rate, regular rhythm, normal heart sounds and intact distal pulses.   Pulmonary/Chest: Effort normal and breath sounds without rales or wheezing  Abdominal: Soft. Bowel sounds are normal. NT. No HSM  Musculoskeletal: Normal range of motion. Exhibits no edema.  Lymphadenopathy:  Has no cervical adenopathy.  Neurological: Pt is alert and oriented to person, place, and time. Pt has normal reflexes. No cranial nerve deficit. Motor grossly intact Skin: Skin is warm and dry. No rash noted.  Psychiatric:  Has normal mood and affect. Behavior is normal.  Right knee with FROM, mild discomfort, NT, no effusion Left knee with moderate pain on simple ROM limited to 110 degrees, no effusion, NT, mild crepitus noted Bilat hands with 2nd dorsal compartments bilat mass c/w ganglion cyst near wrist level    Assessment & Plan:

## 2016-02-07 NOTE — Assessment & Plan Note (Signed)
stable overall by history and exam, recent data reviewed with pt, and pt to continue medical treatment as before,  to f/u any worsening symptoms or concerns Lab Results  Component Value Date   HGBA1C 6.2 01/25/2016

## 2016-02-07 NOTE — Assessment & Plan Note (Signed)
Small bilat, for nsaid, also refer sports medicine for further eval and tx

## 2016-02-07 NOTE — Assessment & Plan Note (Signed)

## 2016-02-07 NOTE — Assessment & Plan Note (Signed)
stable overall by history and exam, recent data reviewed with pt, and pt to continue medical treatment as before,  to f/u any worsening symptoms or concerns Lab Results  Component Value Date   LDLCALC 134* 01/25/2016

## 2016-02-07 NOTE — Assessment & Plan Note (Signed)
Mild to mod, for nsaid prn,  to f/u any worsening symptoms or concerns 

## 2016-02-18 ENCOUNTER — Ambulatory Visit: Payer: 59 | Admitting: Family Medicine

## 2016-03-07 ENCOUNTER — Ambulatory Visit: Payer: 59 | Admitting: Family Medicine

## 2016-09-12 ENCOUNTER — Telehealth: Payer: Self-pay | Admitting: Internal Medicine

## 2016-09-12 NOTE — Telephone Encounter (Signed)
I called patient about a request to see Dr. Katrinka BlazingSmith. He is a new patient. No answer LVM to call back and make and appointment.

## 2016-10-08 ENCOUNTER — Emergency Department (HOSPITAL_COMMUNITY)
Admission: EM | Admit: 2016-10-08 | Discharge: 2016-10-08 | Disposition: A | Discharge status: Home / Self Care | Payer: 59 | Attending: Emergency Medicine | Admitting: Emergency Medicine

## 2016-10-08 ENCOUNTER — Encounter (HOSPITAL_COMMUNITY): Payer: Self-pay | Admitting: Emergency Medicine

## 2016-10-08 DIAGNOSIS — Z7982 Long term (current) use of aspirin: Secondary | ICD-10-CM | POA: Diagnosis not present

## 2016-10-08 DIAGNOSIS — Y9389 Activity, other specified: Secondary | ICD-10-CM | POA: Diagnosis not present

## 2016-10-08 DIAGNOSIS — M7021 Olecranon bursitis, right elbow: Secondary | ICD-10-CM | POA: Diagnosis not present

## 2016-10-08 DIAGNOSIS — M79601 Pain in right arm: Secondary | ICD-10-CM | POA: Diagnosis present

## 2016-10-08 MED ORDER — NAPROXEN 500 MG PO TABS: 500.0000 mg | ORAL_TABLET | Freq: Two times a day (BID) | ORAL | 0 refills | Status: DC

## 2016-10-08 MED ORDER — NAPROXEN 500 MG PO TABS
500.0000 mg | ORAL_TABLET | Freq: Once | ORAL | Status: AC
  Administered 2016-10-08: 500 mg via ORAL
  Filled 2016-10-08: qty 1

## 2016-10-08 NOTE — ED Provider Notes (Signed)
[facility]-EMERGENCY DEPT Provider Note   CSN: [id number] Arrival date & time: 10/08/16  [id number]  By signing my name below, I, Manuel Berger, attest that this documentation has been prepared under the direction and in the presence of Terance HartKelly Krystelle Prashad, PA-C. Electronically Signed: Bridgette HabermannMaria Berger, ED Scribe. 10/08/16. 8:35 PM.  History   Chief Complaint Chief Complaint  Patient presents with  . Arm Pain   HPI Comments: Manuel Berger is a 60 y.o. male with h/o bursitis and Hepatitis C who presents to the Emergency Department complaining of constant, throbbing, 8/10 left arm pain onset two days ago. Pt notes that the pain starts at his left wrist and shoots up to his upper arm; pt notes that the pain is most prominent in his elbow. Pt was sitting at the time of onset and denies any specific injury or trauma but states he works for a [employer] company and regularly does heavy lifting. Pain is exacerbated with palpation and bending of his elbow. Pt has not taken any OTC medications PTA. He denies h/o gout. Pt does not smoke or drink alcohol. Pt denies fever, chills, or any other associated symptoms.   The history is provided by the patient. No language interpreter was used.    Past Medical History:  Diagnosis Date  . ALLERGIC RHINITIS 12/05/2007  . BURSITIS, LEFT HIP 04/30/2008  . GLUCOSE INTOLERANCE 12/05/2007  . HEPATITIS C 12/05/2007  . HYPERLIPIDEMIA 12/05/2007  . Impaired glucose tolerance 03/23/2011  . KNEE PAIN, BILATERAL 03/09/2010  . OSTEOARTHRITIS, KNEES, BILATERAL 04/30/2008    Patient Active Problem List   Diagnosis Date Noted  . Bilateral ganglion cysts of wrists 01/25/2016  . Bilateral knee pain 10/30/2014  . Lower back pain 07/25/2013  . Right ankle sprain 03/24/2011  . Impaired glucose tolerance 03/23/2011  . Preventative health care 03/23/2011  . OSTEOARTHRITIS, KNEES, BILATERAL 04/30/2008  . BURSITIS, LEFT HIP 04/30/2008  . HEPATITIS C 12/05/2007  . Hyperlipidemia 12/05/2007  . ALLERGIC  RHINITIS 12/05/2007    History reviewed. No pertinent surgical history.     Home Medications    Prior to Admission medications   Medication Sig Start Date End Date Taking? Authorizing Provider  aspirin 81 MG EC tablet Take 81 mg by mouth daily.      Historical Provider, MD  diclofenac (VOLTAREN) 75 MG EC tablet Take 1 tablet (75 mg total) by mouth 2 (two) times daily. As needed 01/25/16   Corwin LevinsJames W John, MD  fexofenadine (ALLEGRA) 180 MG tablet Take 1 tablet (180 mg total) by mouth daily. 03/26/12   Corwin LevinsJames W John, MD  meclizine (ANTIVERT) 25 MG tablet Take 1 tablet (25 mg total) by mouth 3 (three) times daily as needed for dizziness. 09/06/15   Raeford RazorStephen Kohut, MD  pravastatin (PRAVACHOL) 40 MG tablet Take 1 tablet (40 mg total) by mouth daily. 10/30/14   Corwin LevinsJames W John, MD    Family History Family History  Problem Relation Age of Onset  . Diabetes Mother   . Hypertension Mother   . COPD Father   . Heart disease Father     CHF    Social History Social History  Substance Use Topics  . Smoking status: Never Smoker  . Smokeless tobacco: Never Used  . Alcohol use No     Allergies   Patient has no known allergies.   Review of Systems Review of Systems  Constitutional: Negative for chills and fever.  Musculoskeletal: Positive for arthralgias, joint swelling and myalgias.  Neurological: Negative for numbness.  Physical Exam Updated Vital Signs BP 179/88 (BP Location: Right Arm)   Pulse 86   Temp 99.3 F (37.4 C) (Oral)   Resp 18   Ht 5\' 6"  (1.676 m)   Wt 180 lb (81.6 kg)   SpO2 99%   BMI 29.05 kg/m   Physical Exam  Constitutional: He appears well-developed and well-nourished.  HENT:  Head: Normocephalic.  Eyes: Conjunctivae are normal.  Cardiovascular: Normal rate.   Pulmonary/Chest: Effort normal. No respiratory distress.  Abdominal: He exhibits no distension.  Musculoskeletal: Normal range of motion.  Right elbow: Olecranon is boggy and warm with obvious  swelling. No redness. There is decreased ROM due to pain. Mild tenderness of forearm and shoulder.   Neurological: He is alert.  Skin: Skin is warm and dry.  Psychiatric: He has a normal mood and affect. His behavior is normal.  Nursing note and vitals reviewed.    ED Treatments / Results  DIAGNOSTIC STUDIES: Oxygen Saturation is 99% on RA, normal by my interpretation.    COORDINATION OF CARE: 8:34 PM Discussed treatment plan with pt at bedside and pt agreed to plan.  Labs (all labs ordered are listed, but only abnormal results are displayed) Labs Reviewed - No data to display  EKG  EKG Interpretation None       Radiology No results found.  Procedures Procedures (including critical care time)  Medications Ordered in ED Medications  naproxen (NAPROSYN) tablet 500 mg (500 mg Oral Given 10/08/16 2123)     Initial Impression / Assessment and Plan / ED Course  I have reviewed the triage vital signs and the nursing notes.  Pertinent labs & imaging results that were available during my care of the patient were reviewed by me and considered in my medical decision making (see chart for details).  Clinical Course    60 year old male with olecranon bursitis. He is afebrile and not tachycardic - low suspicion for septic joint. No injury - imaging is not indicated. Naproxen given in ED as well as ice. Advised anti-inflammatories, ice, and ortho follow up. Patient is NAD, non-toxic, with stable VS. Patient is informed of clinical course, understands medical decision making process, and agrees with plan. Opportunity for questions provided and all questions answered. Return precautions given.   Final Clinical Impressions(s) / ED Diagnoses   Final diagnoses:  Olecranon bursitis of right elbow    New Prescriptions Discharge Medication List as of 10/08/2016  9:13 PM    START taking these medications   Details  naproxen (NAPROSYN) 500 MG tablet Take 1 tablet (500 mg total) by  mouth 2 (two) times daily., Starting Sat 10/08/2016, Print       I personally performed the services described in this documentation, which was scribed in my presence. The recorded information has been reviewed and is accurate.     Bethel BornKelly Marie Rhilyn Battle, PA-C 10/08/16 2137    Pricilla LovelessScott Goldston, MD 10/17/16 [phone]

## 2016-10-08 NOTE — ED Notes (Signed)
EKG given to EDP,Goldston,.MD., for review. 

## 2016-10-08 NOTE — ED Triage Notes (Addendum)
Pt from home with c/o a throbbing left arm pain x 2 days. Pt works for a [employer] company and does regular heavy lifting. Pt states that touching the arm makes it feel worse. Pt states the pain begins at his wrist and shoots to his elbow and then to his upper arm. Pt has full range of motion. Pt denies injury and states pain was gradual in onset

## 2016-10-08 NOTE — Discharge Instructions (Signed)
Take anti-inflammatories for the next week Apply ice to elbow for 20 minutes at a time, several times a day Follow up with orthopedics for follow up

## 2017-01-30 ENCOUNTER — Other Ambulatory Visit (INDEPENDENT_AMBULATORY_CARE_PROVIDER_SITE_OTHER): Payer: 59

## 2017-01-30 DIAGNOSIS — Z0000 Encounter for general adult medical examination without abnormal findings: Secondary | ICD-10-CM

## 2017-01-30 DIAGNOSIS — R7302 Impaired glucose tolerance (oral): Secondary | ICD-10-CM

## 2017-01-30 LAB — LIPID PANEL
CHOL/HDL RATIO: 3
Cholesterol: 195 mg/dL (ref 0–200)
HDL: 56.6 mg/dL (ref >39.00)
LDL Cholesterol: 123 mg/dL — ABNORMAL HIGH (ref 0–99)
NonHDL: 138.51
TRIGLYCERIDES: 79 mg/dL (ref 0.0–149.0)
VLDL: 15.8 mg/dL (ref 0.0–40.0)

## 2017-01-30 LAB — CBC WITH DIFFERENTIAL/PLATELET
BASOS PCT: 0.9 % (ref 0.0–3.0)
Basophils Absolute: 0.1 10*3/uL (ref 0.0–0.1)
EOS ABS: 0.6 10*3/uL (ref 0.0–0.7)
Eosinophils Relative: 9.7 % — ABNORMAL HIGH (ref 0.0–5.0)
HCT: 41.1 % (ref 39.0–52.0)
Hemoglobin: 13.7 g/dL (ref 13.0–17.0)
LYMPHS ABS: 2.7 10*3/uL (ref 0.7–4.0)
Lymphocytes Relative: 43.7 % (ref 12.0–46.0)
MCHC: 33.4 g/dL (ref 30.0–36.0)
MCV: 92.3 fl (ref 78.0–100.0)
MONO ABS: 0.7 10*3/uL (ref 0.1–1.0)
Monocytes Relative: 10.6 % (ref 3.0–12.0)
NEUTROS ABS: 2.2 10*3/uL (ref 1.4–7.7)
NEUTROS PCT: 35.1 % — AB (ref 43.0–77.0)
PLATELETS: 188 10*3/uL (ref 150.0–400.0)
RBC: 4.45 Mil/uL (ref 4.22–5.81)
RDW: 13.6 % (ref 11.5–15.5)
WBC: 6.3 10*3/uL (ref 4.0–10.5)

## 2017-01-30 LAB — BASIC METABOLIC PANEL
BUN: 12 mg/dL (ref 6–23)
CHLORIDE: 106 meq/L (ref 96–112)
CO2: 28 mEq/L (ref 19–32)
Calcium: 9.4 mg/dL (ref 8.4–10.5)
Creatinine, Ser: 1.04 mg/dL (ref 0.40–1.50)
GFR: 93.07 mL/min (ref >60.00)
Glucose, Bld: 103 mg/dL — ABNORMAL HIGH (ref 70–99)
POTASSIUM: 4.2 meq/L (ref 3.5–5.1)
Sodium: 141 mEq/L (ref 135–145)

## 2017-01-30 LAB — URINALYSIS, ROUTINE W REFLEX MICROSCOPIC
Bilirubin Urine: NEGATIVE
Hgb urine dipstick: NEGATIVE
Ketones, ur: NEGATIVE
Leukocytes, UA: NEGATIVE
NITRITE: NEGATIVE
RBC / HPF: NONE SEEN (ref 0–?)
SPECIFIC GRAVITY, URINE: 1.02 (ref 1.000–1.030)
Total Protein, Urine: NEGATIVE
URINE GLUCOSE: NEGATIVE
Urobilinogen, UA: 0.2 (ref 0.0–1.0)
WBC, UA: NONE SEEN (ref 0–?)
pH: 6 (ref 5.0–8.0)

## 2017-01-30 LAB — HEPATIC FUNCTION PANEL
ALK PHOS: 59 U/L (ref 39–117)
ALT: 82 U/L — AB (ref 0–53)
AST: 57 U/L — AB (ref 0–37)
Albumin: 3.9 g/dL (ref 3.5–5.2)
BILIRUBIN DIRECT: 0.1 mg/dL (ref 0.0–0.3)
BILIRUBIN TOTAL: 0.5 mg/dL (ref 0.2–1.2)
Total Protein: 7.4 g/dL (ref 6.0–8.3)

## 2017-01-30 LAB — PSA: PSA: 1.28 ng/mL (ref 0.10–4.00)

## 2017-01-30 LAB — HEMOGLOBIN A1C: Hgb A1c MFr Bld: 6.2 % (ref 4.6–6.5)

## 2017-01-30 LAB — TSH: TSH: 5.52 u[IU]/mL — AB (ref 0.35–4.50)

## 2017-02-02 ENCOUNTER — Ambulatory Visit (INDEPENDENT_AMBULATORY_CARE_PROVIDER_SITE_OTHER): Payer: 59 | Admitting: Internal Medicine

## 2017-02-02 ENCOUNTER — Encounter: Payer: Self-pay | Admitting: Internal Medicine

## 2017-02-02 VITALS — BP 132/88 | HR 68 | Temp 98.6°F | Ht 66.0 in | Wt 180.0 lb

## 2017-02-02 DIAGNOSIS — E785 Hyperlipidemia, unspecified: Secondary | ICD-10-CM | POA: Diagnosis not present

## 2017-02-02 DIAGNOSIS — R739 Hyperglycemia, unspecified: Secondary | ICD-10-CM

## 2017-02-02 DIAGNOSIS — B1710 Acute hepatitis C without hepatic coma: Secondary | ICD-10-CM | POA: Diagnosis not present

## 2017-02-02 DIAGNOSIS — R7989 Other specified abnormal findings of blood chemistry: Secondary | ICD-10-CM | POA: Insufficient documentation

## 2017-02-02 DIAGNOSIS — Z0000 Encounter for general adult medical examination without abnormal findings: Secondary | ICD-10-CM | POA: Diagnosis not present

## 2017-02-02 DIAGNOSIS — R946 Abnormal results of thyroid function studies: Secondary | ICD-10-CM

## 2017-02-02 NOTE — Progress Notes (Signed)
Subjective:    Patient ID: Manuel Berger, male    DOB: 1956-06-27, 61 y.o.   MRN: [id number]  HPI Here for wellness and f/u;  Overall doing ok;  Pt denies Chest pain, worsening SOB, DOE, wheezing, orthopnea, PND, worsening LE edema, palpitations, dizziness or syncope.  Pt denies neurological change such as new headache, facial or extremity weakness.  Pt denies polydipsia, polyuria, or low sugar symptoms. Pt states overall good compliance with treatment and medications, good tolerability, and has been trying to follow appropriate diet.  Pt denies worsening depressive symptoms, suicidal ideation or panic. No fever, night sweats, wt loss, loss of appetite, or other constitutional symptoms.  Pt states good ability with ADL's, has low fall risk, home safety reviewed and adequate, no other significant changes in hearing or vision, and not active with exercise, but plans to try being more active soon.  Denies hyper or hypo thyroid symptoms such as voice, skin or hair change. Past Medical History:  Diagnosis Date  . ALLERGIC RHINITIS 12/05/2007  . BURSITIS, LEFT HIP 04/30/2008  . GLUCOSE INTOLERANCE 12/05/2007  . HEPATITIS C 12/05/2007  . HYPERLIPIDEMIA 12/05/2007  . Impaired glucose tolerance 03/23/2011  . KNEE PAIN, BILATERAL 03/09/2010  . OSTEOARTHRITIS, KNEES, BILATERAL 04/30/2008   History reviewed. No pertinent surgical history.  reports that he has never smoked. He has never used smokeless tobacco. He reports that he does not drink alcohol. His drug history is not on file. family history includes COPD in his father; Diabetes in his mother; Heart disease in his father; Hypertension in his mother. No Known Allergies Current Outpatient Prescriptions on File Prior to Visit  Medication Sig Dispense Refill  . aspirin 81 MG EC tablet Take 81 mg by mouth daily.      . fexofenadine (ALLEGRA) 180 MG tablet Take 1 tablet (180 mg total) by mouth daily. 90 tablet 3  . meclizine (ANTIVERT) 25 MG tablet Take 1  tablet (25 mg total) by mouth 3 (three) times daily as needed for dizziness. 20 tablet 0  . naproxen (NAPROSYN) 500 MG tablet Take 1 tablet (500 mg total) by mouth 2 (two) times daily. 30 tablet 0  . pravastatin (PRAVACHOL) 40 MG tablet Take 1 tablet (40 mg total) by mouth daily. 90 tablet 3   No current facility-administered medications on file prior to visit.    Review of Systems Constitutional: Negative for increased diaphoresis, or other activity, appetite or siginficant weight change other than noted HENT: Negative for worsening hearing loss, ear pain, facial swelling, mouth sores and neck stiffness.   Eyes: Negative for other worsening pain, redness or visual disturbance.  Respiratory: Negative for choking or stridor Cardiovascular: Negative for other chest pain and palpitations.  Gastrointestinal: Negative for worsening diarrhea, blood in stool, or abdominal distention Genitourinary: Negative for hematuria, flank pain or change in urine volume.  Musculoskeletal: Negative for myalgias or other joint complaints.  Skin: Negative for other color change and wound or drainage.  Neurological: Negative for syncope and numbness. other than noted Hematological: Negative for adenopathy. or other swelling Psychiatric/Behavioral: Negative for hallucinations, SI, self-injury, decreased concentration or other worsening agitation.  All other system neg per pt    Objective:   Physical Exam BP 132/88   Pulse 68   Temp 98.6 F (37 C) (Oral)   Ht 5\' 6"  (1.676 m)   Wt 180 lb (81.6 kg)   SpO2 98%   BMI 29.05 kg/m  VS noted,  Constitutional: Pt is oriented to person,  place, and time. Appears well-developed and well-nourished, in no significant distress Head: Normocephalic and atraumatic  Eyes: Conjunctivae and EOM are normal. Pupils are equal, round, and reactive to light Right Ear: External ear normal.  Left Ear: External ear normal Nose: Nose normal.  Mouth/Throat: Oropharynx is clear and  moist  Neck: Normal range of motion. Neck supple. No JVD present. No tracheal deviation present or significant neck LA or mass Cardiovascular: Normal rate, regular rhythm, normal heart sounds and intact distal pulses.   Pulmonary/Chest: Effort normal and breath sounds without rales or wheezing  Abdominal: Soft. Bowel sounds are normal. NT. No HSM  Musculoskeletal: Normal range of motion. Exhibits no edema Lymphadenopathy: Has no cervical adenopathy.  Neurological: Pt is alert and oriented to person, place, and time. Pt has normal reflexes. No cranial nerve deficit. Motor grossly intact Skin: Skin is warm and dry. No rash noted or new ulcers Psychiatric:  Has normal mood and affect. Behavior is normal.  No other exam findings Lab Results  Component Value Date   WBC 6.3 01/30/2017   HGB 13.7 01/30/2017   HCT 41.1 01/30/2017   PLT 188.0 01/30/2017   GLUCOSE 103 (H) 01/30/2017   CHOL 195 01/30/2017   TRIG 79.0 01/30/2017   HDL 56.60 01/30/2017   LDLDIRECT 120.7 11/27/2007   LDLCALC 123 (H) 01/30/2017   ALT 82 (H) 01/30/2017   AST 57 (H) 01/30/2017   NA 141 01/30/2017   K 4.2 01/30/2017   CL 106 01/30/2017   CREATININE 1.04 01/30/2017   BUN 12 01/30/2017   CO2 28 01/30/2017   TSH 5.52 (H) 01/30/2017   PSA 1.28 01/30/2017   HGBA1C 6.2 01/30/2017      Assessment & Plan:

## 2017-02-02 NOTE — Progress Notes (Signed)
Pre visit review using our clinic review tool, if applicable. No additional management support is needed unless otherwise documented below in the visit note. 

## 2017-02-02 NOTE — Patient Instructions (Signed)
Please continue all other medications as before, and refills have been done if requested.  Please have the pharmacy call with any other refills you may need.  Please continue your efforts at being more active, low cholesterol diet, and weight control.  You are otherwise up to date with prevention measures today.  Please keep your appointments with your specialists as you may have planned  Please return in 6 months, or sooner if needed, with Lab testing done 3-5 days before  

## 2017-02-04 NOTE — Assessment & Plan Note (Signed)
?   Significance, for recheck next visit,  to f/u any worsening symptoms or concerns

## 2017-02-04 NOTE — Assessment & Plan Note (Signed)
Mild, for wt control and diet efforts

## 2017-02-04 NOTE — Assessment & Plan Note (Signed)
?   Chronicity, for Hep C RNA quantitative

## 2017-02-04 NOTE — Assessment & Plan Note (Signed)

## 2017-02-04 NOTE — Assessment & Plan Note (Signed)
Mild elev LDL, to cont same satin and for better lower chol diet per pt preference

## 2017-06-05 ENCOUNTER — Emergency Department (HOSPITAL_COMMUNITY)
Admission: EM | Admit: 2017-06-05 | Discharge: 2017-06-05 | Disposition: A | Discharge status: Home / Self Care | Payer: 59 | Attending: Emergency Medicine | Admitting: Emergency Medicine

## 2017-06-05 DIAGNOSIS — Z7982 Long term (current) use of aspirin: Secondary | ICD-10-CM | POA: Diagnosis not present

## 2017-06-05 DIAGNOSIS — Z79899 Other long term (current) drug therapy: Secondary | ICD-10-CM | POA: Insufficient documentation

## 2017-06-05 DIAGNOSIS — M79674 Pain in right toe(s): Secondary | ICD-10-CM | POA: Insufficient documentation

## 2017-06-05 DIAGNOSIS — L02612 Cutaneous abscess of left foot: Secondary | ICD-10-CM | POA: Diagnosis not present

## 2017-06-05 DIAGNOSIS — M79671 Pain in right foot: Secondary | ICD-10-CM | POA: Diagnosis not present

## 2017-06-05 MED ORDER — INDOMETHACIN 50 MG PO CAPS: 50.0000 mg | ORAL_CAPSULE | Freq: Two times a day (BID) | ORAL | 0 refills | Status: DC

## 2017-06-05 MED ORDER — LIDOCAINE-EPINEPHRINE (PF) 2 %-1:200000 IJ SOLN
10.0000 mL | Freq: Once | INTRAMUSCULAR | Status: AC
  Administered 2017-06-05: 10 mL
  Filled 2017-06-05: qty 20

## 2017-06-05 NOTE — ED Provider Notes (Signed)
[facility]-EMERGENCY DEPT Provider Note   CSN: [id number] Arrival date & time: 06/05/17  1821     History   Chief Complaint Chief Complaint  Patient presents with  . Toe Pain    HPI Manuel Berger is a 61 y.o. male presenting with right great toe pain.  Patient states that he first knows the pain on Wednesday when he was walking. Pain has increased over the past 5 days, but remained isolated to the right great toe. Pain is worse on the medial aspect by the nailbed. Pain is described as throbbing, worse with walking or touching. Patient does not have a history of gout or hypertension, is not taking any medicines. He has tried Tylenol without relief. He denies systemic fevers, chills, or pain elsewhere in his foot. He denies pain in other toes. He denies injury or trauma to the toe. He denies numbness or tingling.  HPI  Past Medical History:  Diagnosis Date  . ALLERGIC RHINITIS 12/05/2007  . BURSITIS, LEFT HIP 04/30/2008  . GLUCOSE INTOLERANCE 12/05/2007  . HEPATITIS C 12/05/2007  . HYPERLIPIDEMIA 12/05/2007  . Impaired glucose tolerance 03/23/2011  . KNEE PAIN, BILATERAL 03/09/2010  . OSTEOARTHRITIS, KNEES, BILATERAL 04/30/2008    Patient Active Problem List   Diagnosis Date Noted  . Abnormal TSH 02/02/2017  . Bilateral ganglion cysts of wrists 01/25/2016  . Bilateral knee pain 10/30/2014  . Lower back pain 07/25/2013  . Right ankle sprain 03/24/2011  . Hyperglycemia 03/23/2011  . Preventative health care 03/23/2011  . OSTEOARTHRITIS, KNEES, BILATERAL 04/30/2008  . BURSITIS, LEFT HIP 04/30/2008  . HEPATITIS C 12/05/2007  . Hyperlipidemia 12/05/2007  . ALLERGIC RHINITIS 12/05/2007    No past surgical history on file.     Home Medications    Prior to Admission medications   Medication Sig Start Date End Date Taking? Authorizing Provider  aspirin 81 MG EC tablet Take 81 mg by mouth daily.      [provider]  fexofenadine (ALLEGRA) 180 MG tablet Take 1 tablet  (180 mg total) by mouth daily. 03/26/12   Corwin LevinsJohn, James W, MD  indomethacin (INDOCIN) 50 MG capsule Take 1 capsule (50 mg total) by mouth 2 (two) times daily with a meal. 06/05/17   Kess Mcilwain, PA-C  meclizine (ANTIVERT) 25 MG tablet Take 1 tablet (25 mg total) by mouth 3 (three) times daily as needed for dizziness. 09/06/15   Raeford RazorKohut, Stephen, MD  naproxen (NAPROSYN) 500 MG tablet Take 1 tablet (500 mg total) by mouth 2 (two) times daily. 10/08/16   Bethel BornGekas, Kelly Marie, PA-C  pravastatin (PRAVACHOL) 40 MG tablet Take 1 tablet (40 mg total) by mouth daily. 10/30/14   Corwin LevinsJohn, James W, MD    Family History Family History  Problem Relation Age of Onset  . Diabetes Mother   . Hypertension Mother   . COPD Father   . Heart disease Father        CHF    Social History Social History  Substance Use Topics  . Smoking status: Never Smoker  . Smokeless tobacco: Never Used  . Alcohol use No     Allergies   Patient has no known allergies.   Review of Systems Review of Systems  Musculoskeletal: Positive for arthralgias and joint swelling.  Neurological: Negative for numbness.     Physical Exam Updated Vital Signs BP (!) 141/97 (BP Location: Left Arm)   Pulse 76   Temp 98.9 F (37.2 C) (Oral)   Resp 16   SpO2  100%   Physical Exam  Constitutional: He is oriented to person, place, and time. He appears well-developed and well-nourished. No distress.  HENT:  Head: Normocephalic and atraumatic.  Eyes: Pupils are equal, round, and reactive to light.  Neck: Normal range of motion.  Cardiovascular: Normal rate.   Pulmonary/Chest: Effort normal.  Musculoskeletal: Normal range of motion.  Obvious swelling at the medial aspect of the right great toe distal to the PIP. Area is tender to palpation with increased warmth. Fluctuant area approximately 1.5 cm adjacent to the nail bed. No increased tenderness to palpation of the PIP or MTP joints. No tenderness to palpation elsewhere in the foot.  Sensation intact. No drainage noted at this time.  Neurological: He is alert and oriented to person, place, and time.  Skin: Skin is warm and dry.  Warmth of medial aspect of distal right great toe.  Psychiatric: He has a normal mood and affect.  Nursing note and vitals reviewed.    ED Treatments / Results  Labs (all labs ordered are listed, but only abnormal results are displayed) Labs Reviewed - No data to display  EKG  EKG Interpretation None       Radiology No results found.  Procedures .Marland KitchenIncision and Drainage Date/Time: 06/05/2017 8:02 PM Performed by: Alveria Apley Authorized by: Alveria Apley   Consent:    Consent obtained:  Verbal   Consent given by:  Patient   Risks discussed:  Bleeding, pain and incomplete drainage Location:    Type:  Abscess   Size:  1.5 cm   Location:  Lower extremity   Lower extremity location:  Toe   Toe location:  R big toe Pre-procedure details:    Skin preparation:  Betadine Anesthesia (see MAR for exact dosages):    Anesthesia method:  Local infiltration   Local anesthetic:  Lidocaine 2% WITH epi Procedure type:    Complexity:  Simple Procedure details:    Incision types:  Cruciate and single straight   Scalpel blade:  11   Wound management:  Irrigated with saline   Drainage:  Bloody   Wound treatment:  Wound left open   Packing materials:  None Post-procedure details:    Patient tolerance of procedure:  Tolerated well, no immediate complications   (including critical care time)  Medications Ordered in ED Medications  lidocaine-EPINEPHrine (XYLOCAINE W/EPI) 2 %-1:200000 (PF) injection 10 mL (10 mLs Infiltration Given by Other 06/05/17 2013)     Initial Impression / Assessment and Plan / ED Course  I have reviewed the triage vital signs and the nursing notes.  Pertinent labs & imaging results that were available during my care of the patient were reviewed by me and considered in my medical decision making (see  chart for details).     Patient presenting with a five-day history of R toe pain. No history of gout. Physical exam shows well-defined fluctuant area of the medial great toe adjacent to the nail bed. Symptoms likely due to paronychia. Will perform I&D for symptom relief.  I&D did not express any purulent material. Possibility that sxs could be due to inflammatory or gout process. Will start patient on indomethacin and have patient follow-up with primary care for further evaluation. Discussed findings with patient. Discussed aftercare of I&D, and told patient to leave dressing on for 24 hrs. Pt may then remove dressing and wash gently with soap and water, and apply new dressing. Return precautions given. Patient states he understands and agrees to plan.  Final Clinical  Impressions(s) / ED Diagnoses   Final diagnoses:  Great toe pain, right    New Prescriptions New Prescriptions   INDOMETHACIN (INDOCIN) 50 MG CAPSULE    Take 1 capsule (50 mg total) by mouth 2 (two) times daily with a meal.     Alveria Apley, PA-C 06/05/17 2015    Cardama, Amadeo Garnet, MD 06/06/17 [phone]

## 2017-06-05 NOTE — ED Triage Notes (Signed)
Pt c/o pain, erythema, and edema to lateral right great toe from 1st metatarsal-phalange joint to interphalange joint onset last Wednesday. No injury, no hx gout.

## 2017-06-05 NOTE — Discharge Instructions (Signed)
Take indomethacin twice daily with meals. Do not take other anti-inflammatories at the same time (Advil, Motrin, ibuprofen, Aleve, naproxen). You may supplement with Tylenol as needed for further pain control. Follow-up with your primary care doctor for further evaluation of your toe pain. Return to the emergency department if you develop fever, chills, redness moving up your foot, or worsening pain.

## 2017-06-07 ENCOUNTER — Encounter: Payer: Self-pay | Admitting: Family Medicine

## 2017-06-07 ENCOUNTER — Encounter: Payer: Self-pay | Admitting: Emergency Medicine

## 2017-06-07 ENCOUNTER — Ambulatory Visit (INDEPENDENT_AMBULATORY_CARE_PROVIDER_SITE_OTHER): Payer: 59 | Admitting: Family Medicine

## 2017-06-07 DIAGNOSIS — M79674 Pain in right toe(s): Secondary | ICD-10-CM

## 2017-06-07 DIAGNOSIS — M79676 Pain in unspecified toe(s): Secondary | ICD-10-CM | POA: Insufficient documentation

## 2017-06-07 MED ORDER — CEPHALEXIN 500 MG PO CAPS: 500.0000 mg | ORAL_CAPSULE | Freq: Two times a day (BID) | ORAL | 0 refills | Status: DC

## 2017-06-07 NOTE — Assessment & Plan Note (Addendum)
He may have an underlying cellulitis. Did have an incision and drainage for apparent a paronychia performed at the emergency department with no improvement. It does not appear to be gout. Ultrasound did not reveal any abscess formation and no evidence of any gouty formation of the first MTP joint of the great toe. - Keflex for 7 days - Can continue indomethacin - Advised follow-up visit if there is no improvement or symptoms worsen. Could consider an x-ray follow-up versus lab work such as a ESR or CRP

## 2017-06-07 NOTE — Progress Notes (Signed)
Manuel Berger - 61 y.o. male MRN [id number]  Date of birth: 03-Feb-1956  SUBJECTIVE:  Including CC & ROS.  Chief Complaint  Patient presents with  . Toe Pain    right big toe went to hospital monday for drainage, patient states toe is still throbbing and hurts to walk. taking idomethacin which helps, but pain comes back   Manuel Berger is a 61 year old male is presenting with right great toe pain. He was seen at the emergency department and was started on indomethacin. He reports that the indomethacin helps with his pain at night. He did report having some hot flashes last night. He denies any rashes. He denies any history of gout. He denies any trauma. He does wear steel toed shoes. He does endorse some swelling of the interphalangeal joint of the right great toe. There is tenderness to the touch the medial aspect of the right great toe. There is no tenderness to the palpation of the first MTP joint.    Review of Systems  Swelling of the IP joint. No rashes. No fever. Some night sweats. Otherwise negative.  HISTORY: Past Medical, Surgical, Social, and Family History Reviewed & Updated per EMR.   Pertinent Historical Findings include:  Past Medical History:  Diagnosis Date  . ALLERGIC RHINITIS 12/05/2007  . BURSITIS, LEFT HIP 04/30/2008  . GLUCOSE INTOLERANCE 12/05/2007  . HEPATITIS C 12/05/2007  . HYPERLIPIDEMIA 12/05/2007  . Impaired glucose tolerance 03/23/2011  . KNEE PAIN, BILATERAL 03/09/2010  . OSTEOARTHRITIS, KNEES, BILATERAL 04/30/2008    No Known Allergies  Family History  Problem Relation Age of Onset  . Diabetes Mother   . Hypertension Mother   . COPD Father   . Heart disease Father        CHF     Social History   Social History  . Marital status: Married    Spouse name: N/A  . Number of children: 4  . Years of education: N/A   Occupational History  . service tech fro [employer]    Social History Main Topics  . Smoking status: Never Smoker  . Smokeless  tobacco: Never Used  . Alcohol use No  . Drug use: Unknown  . Sexual activity: Not on file   Other Topics Concern  . Not on file   Social History Narrative  . No narrative on file     PHYSICAL EXAM:  VS: BP 140/80 (BP Location: Left Arm, Patient Position: Sitting, Cuff Size: Normal)   Pulse 63   Temp 98.2 F (36.8 C) (Oral)   Ht '5\' 6"'  (1.676 m)   Wt 180 lb 3.2 oz (81.7 kg)   SpO2 100%   BMI 29.09 kg/m  PHYSICAL EXAM: Gen: NAD, alert, cooperative with exam, well-appearing HEENT: NCAT, EOMI, clear conjunctiva, supple neck CV: RRR, good S1/S2, no murmur, no edema, capillary refill brisk  Resp: CTABL, no wheezes, non-labored Skin: no rashes, normal turgor  Neuro: no gross deficits.  Psych:  alert and oriented MSK: Tenderness to palpation of the medial aspect of the IP joint of the right great toe. Normal flexion and extension. No purulent material expressed. No tears palpation over the MTP joint. No overlying rash or erythema. Pulses intact. Neurovascular intact.   ASSESSMENT & PLAN:   Great toe pain He may have an underlying cellulitis. Did have an incision and drainage for apparent a paronychia performed at the emergency department with no improvement. It does not appear to be gout. Ultrasound did not reveal any  abscess formation and no evidence of any gouty formation of the first MTP joint of the great toe. - Keflex for 7 days - Can continue indomethacin - Advised follow-up visit if there is no improvement or symptoms worsen. Could consider an x-ray follow-up versus lab work such as a ESR or CRP

## 2017-06-07 NOTE — Patient Instructions (Signed)
Thank you for coming in,   I have sent an antibiotic in for you. If you feel like your symptoms get worse then please follow-up or seek immediate care.   Please feel free to call with any questions or concerns at any time. --Dr. Jordan LikesSchmitz

## 2017-08-04 ENCOUNTER — Other Ambulatory Visit (INDEPENDENT_AMBULATORY_CARE_PROVIDER_SITE_OTHER): Payer: 59

## 2017-08-04 DIAGNOSIS — R739 Hyperglycemia, unspecified: Secondary | ICD-10-CM

## 2017-08-04 DIAGNOSIS — Z0000 Encounter for general adult medical examination without abnormal findings: Secondary | ICD-10-CM

## 2017-08-04 DIAGNOSIS — E785 Hyperlipidemia, unspecified: Secondary | ICD-10-CM

## 2017-08-04 DIAGNOSIS — B1710 Acute hepatitis C without hepatic coma: Secondary | ICD-10-CM

## 2017-08-04 LAB — HEMOGLOBIN A1C: Hgb A1c MFr Bld: 6.1 % (ref 4.6–6.5)

## 2017-08-04 LAB — BASIC METABOLIC PANEL
BUN: 14 mg/dL (ref 6–23)
CALCIUM: 9.1 mg/dL (ref 8.4–10.5)
CO2: 25 mEq/L (ref 19–32)
Chloride: 106 mEq/L (ref 96–112)
Creatinine, Ser: 1.03 mg/dL (ref 0.40–1.50)
GFR: 93.96 mL/min (ref >60.00)
GLUCOSE: 107 mg/dL — AB (ref 70–99)
Potassium: 4 mEq/L (ref 3.5–5.1)
Sodium: 140 mEq/L (ref 135–145)

## 2017-08-04 LAB — TSH: TSH: 2.42 u[IU]/mL (ref 0.35–4.50)

## 2017-08-04 LAB — LIPID PANEL
CHOLESTEROL: 204 mg/dL — AB (ref 0–200)
HDL: 61.7 mg/dL (ref >39.00)
LDL CALC: 128 mg/dL — AB (ref 0–99)
NonHDL: 142.24
TRIGLYCERIDES: 72 mg/dL (ref 0.0–149.0)
Total CHOL/HDL Ratio: 3
VLDL: 14.4 mg/dL (ref 0.0–40.0)

## 2017-08-04 LAB — HEPATIC FUNCTION PANEL
ALBUMIN: 3.8 g/dL (ref 3.5–5.2)
ALT: 65 U/L — ABNORMAL HIGH (ref 0–53)
AST: 50 U/L — AB (ref 0–37)
Alkaline Phosphatase: 57 U/L (ref 39–117)
Bilirubin, Direct: 0.1 mg/dL (ref 0.0–0.3)
Total Bilirubin: 0.4 mg/dL (ref 0.2–1.2)
Total Protein: 7.7 g/dL (ref 6.0–8.3)

## 2017-08-07 LAB — HEPATITIS C RNA QUANTITATIVE
HCV Quantitative Log: 6.61 Log IU/mL — ABNORMAL HIGH
HCV RNA, PCR, QN: 4040000 [IU]/mL — AB

## 2017-08-07 LAB — HIV ANTIBODY (ROUTINE TESTING W REFLEX): HIV: NONREACTIVE

## 2017-08-11 ENCOUNTER — Encounter: Payer: Self-pay | Admitting: Internal Medicine

## 2017-08-11 ENCOUNTER — Ambulatory Visit (INDEPENDENT_AMBULATORY_CARE_PROVIDER_SITE_OTHER): Payer: 59 | Admitting: Internal Medicine

## 2017-08-11 VITALS — BP 138/94 | HR 62 | Temp 98.4°F | Ht 66.0 in | Wt 177.0 lb

## 2017-08-11 DIAGNOSIS — R739 Hyperglycemia, unspecified: Secondary | ICD-10-CM

## 2017-08-11 DIAGNOSIS — J309 Allergic rhinitis, unspecified: Secondary | ICD-10-CM

## 2017-08-11 DIAGNOSIS — B1710 Acute hepatitis C without hepatic coma: Secondary | ICD-10-CM | POA: Diagnosis not present

## 2017-08-11 DIAGNOSIS — Z0000 Encounter for general adult medical examination without abnormal findings: Secondary | ICD-10-CM

## 2017-08-11 DIAGNOSIS — Z23 Encounter for immunization: Secondary | ICD-10-CM

## 2017-08-11 DIAGNOSIS — E785 Hyperlipidemia, unspecified: Secondary | ICD-10-CM | POA: Diagnosis not present

## 2017-08-11 NOTE — Assessment & Plan Note (Signed)
Will hold on further statin use for now given the liver dz,  to f/u any worsening symptoms or concerns, for lower chol diet

## 2017-08-11 NOTE — Assessment & Plan Note (Signed)
Hep C quant RNA elevated, for ID clinic referral., has had no prior tx in past

## 2017-08-11 NOTE — Assessment & Plan Note (Signed)
Ok to restart OTC allegra prn,  to f/u any worsening symptoms or concerns

## 2017-08-11 NOTE — Assessment & Plan Note (Signed)
Asympt, a1c normal, cont diet and wt control

## 2017-08-11 NOTE — Progress Notes (Signed)
Subjective:    Patient ID: Manuel Berger, male    DOB: [birth year], 61 y.o.   MRN: [id number]  HPI  Here to f/u; overall doing ok,  Pt denies chest pain, increasing sob or doe, wheezing, orthopnea, PND, increased LE swelling, palpitations, dizziness or syncope.  Pt denies new neurological symptoms such as new headache, or facial or extremity weakness or numbness.  Pt denies polydipsia, polyuria    Pt states overall good compliance with meds, mostly trying to follow appropriate diet, with wt overall stable,  but little exercise however.  Hep C RNA is positive today, Denies worsening reflux, abd pain, dysphagia, n/v, bowel change or blood.  Does have several wks ongoing nasal allergy symptoms with clearish congestion, itch and sneezing, without fever, pain, ST, cough, swelling or wheezing. Wt Readings from Last 3 Encounters:  08/11/17 177 lb (80.3 kg)  06/07/17 180 lb 3.2 oz (81.7 kg)  02/02/17 180 lb (81.6 kg)   Past Medical History:  Diagnosis Date  . ALLERGIC RHINITIS 12/05/2007  . BURSITIS, LEFT HIP 04/30/2008  . GLUCOSE INTOLERANCE 12/05/2007  . HEPATITIS C 12/05/2007  . HYPERLIPIDEMIA 12/05/2007  . Impaired glucose tolerance 03/23/2011  . KNEE PAIN, BILATERAL 03/09/2010  . OSTEOARTHRITIS, KNEES, BILATERAL 04/30/2008   No past surgical history on file.  reports that he has never smoked. He has never used smokeless tobacco. He reports that he does not drink alcohol. His drug history is not on file. family history includes COPD in his father; Diabetes in his mother; Heart disease in his father; Hypertension in his mother. No Known Allergies Current Outpatient Prescriptions on File Prior to Visit  Medication Sig Dispense Refill  . aspirin 81 MG EC tablet Take 81 mg by mouth daily.      . fexofenadine (ALLEGRA) 180 MG tablet Take 1 tablet (180 mg total) by mouth daily. 90 tablet 3  . indomethacin (INDOCIN) 50 MG capsule Take 1 capsule (50 mg total) by mouth 2 (two) times daily with a meal. 15  capsule 0  . meclizine (ANTIVERT) 25 MG tablet Take 1 tablet (25 mg total) by mouth 3 (three) times daily as needed for dizziness. 20 tablet 0  . naproxen (NAPROSYN) 500 MG tablet Take 1 tablet (500 mg total) by mouth 2 (two) times daily. 30 tablet 0   No current facility-administered medications on file prior to visit.    Review of Systems  Constitutional: Negative for other unusual diaphoresis or sweats HENT: Negative for ear discharge or swelling Eyes: Negative for other worsening visual disturbances Respiratory: Negative for stridor or other swelling  Gastrointestinal: Negative for worsening distension or other blood Genitourinary: Negative for retention or other urinary change Musculoskeletal: Negative for other MSK pain or swelling Skin: Negative for color change or other new lesions Neurological: Negative for worsening tremors and other numbness  Psychiatric/Behavioral: Negative for worsening agitation or other fatigue ALl other system neg per pt    Objective:   Physical Exam BP (!) 138/94   Pulse 62   Temp 98.4 F (36.9 C) (Oral)   Ht  (1.676 m)   Wt 177 lb (80.3 kg)   SpO2 99%   BMI 28.57 kg/m  VS noted, not ill appearing Constitutional: Pt appears in NAD HENT: Head: NCAT.  Right Ear: External ear normal.  Left Ear: External ear normal.  Bilat tm's with mild erythema.  Max sinus areas non tender.  Pharynx with mild erythema, no exudate Eyes: . Pupils are equal, round, and reactive  to light. Conjunctivae and EOM are normal Nose: without d/c or deformity Neck: Neck supple. Gross normal ROM Cardiovascular: Normal rate and regular rhythm.   Pulmonary/Chest: Effort normal and breath sounds without rales or wheezing.  Abd:  Soft, NT, ND, + BS, no organomegaly Neurological: Pt is alert. At baseline orientation, motor grossly intact Skin: Skin is warm. No rashes, other new lesions, no LE edema Psychiatric: Pt behavior is normal without agitation  No other exam  findings    Assessment & Plan:

## 2017-08-11 NOTE — Patient Instructions (Addendum)
Please continue all other medications as before, and refills have been done if requested.  Please have the pharmacy call with any other refills you may need.  Please continue your efforts at being more active, low cholesterol diet, and weight control.  Please keep your appointments with your specialists as you may have planned  You will be contacted regarding the referral for: Infectious Disease clinic  Please return in 6 months, or sooner if needed, with Lab testing done 3-5 days before

## 2017-08-31 ENCOUNTER — Ambulatory Visit (INDEPENDENT_AMBULATORY_CARE_PROVIDER_SITE_OTHER): Payer: 59 | Admitting: Internal Medicine

## 2017-08-31 ENCOUNTER — Encounter: Payer: Self-pay | Admitting: Internal Medicine

## 2017-08-31 ENCOUNTER — Telehealth: Payer: Self-pay

## 2017-08-31 VITALS — BP 159/89 | HR 53 | Temp 98.1°F | Wt 178.0 lb

## 2017-08-31 DIAGNOSIS — B182 Chronic viral hepatitis C: Secondary | ICD-10-CM | POA: Diagnosis not present

## 2017-08-31 NOTE — Telephone Encounter (Signed)
Per verbal order by Dr. Luciana Axeomer I called [facility] to schedule [name] for a Elastography pt is currently scheduled for 09/05/17 at 11:30 am. I was instructed to inform the pt to arrive 15 mins before his appt to check-in and also for the pt not to have anything to eat or drink 6-8 prior to his appt. I called the pt to inform them of the appt was hoping to reschedule the appt for Thursday I gave the pt [facility]'s scheduling number if would like to move the appt. Pt stated he will keep his current appt for now and call to see if he can reschedule. Pt understood the instructions given and confirmed appt before ending the call. Manuel [place], [place]

## 2017-08-31 NOTE — Progress Notes (Signed)
[facility]   CC: consideration for treatment for chronic hepatitis C  HPI:  +Manuel Berger is a 61 y.o. male who presents for initial evaluation and management of chronic hepatitis C.  Patient tested positive over 10 years ago. Hepatitis C-associated risk factors present are: none. Patient denies intranasal drug use, IV drug abuse. Patient has had other studies performed. Results: hepatitis C RNA by PCR, result: positive. Patient has not had prior treatment for Hepatitis C. Patient does not have a past history of liver disease. Patient does not have a family history of liver disease. Patient does not  have associated signs or symptoms related to liver disease.  Labs reviewed and confirm chronic hepatitis C with a positive viral load.   Records reviewed from Epic and he had a liver biopsy in 2005 and minimal activity and F0.       Patient does not have documented immunity to Hepatitis A. Patient does not have documented immunity to Hepatitis B.    Review of Systems:  Constitutional: negative for fatigue and malaise Gastrointestinal: negative for diarrhea Musculoskeletal: negative for myalgias and arthralgias All other systems reviewed and are negative       Past Medical History:  Diagnosis Date  . ALLERGIC RHINITIS 12/05/2007  . BURSITIS, LEFT HIP 04/30/2008  . GLUCOSE INTOLERANCE 12/05/2007  . HEPATITIS C 12/05/2007  . HYPERLIPIDEMIA 12/05/2007  . Impaired glucose tolerance 03/23/2011  . KNEE PAIN, BILATERAL 03/09/2010  . OSTEOARTHRITIS, KNEES, BILATERAL 04/30/2008    Prior to Admission medications   Medication Sig Start Date End Date Taking? Authorizing Provider  aspirin 81 MG EC tablet Take 81 mg by mouth daily.     Yes [provider]  fexofenadine (ALLEGRA) 180 MG tablet Take 1 tablet (180 mg total) by mouth daily. 03/26/12  Yes Corwin LevinsJohn, James W, MD  naproxen (NAPROSYN) 500 MG tablet Take 1 tablet (500 mg total) by mouth 2 (two) times daily.  10/08/16  Yes Bethel BornGekas, Kelly Marie, PA-C  indomethacin (INDOCIN) 50 MG capsule Take 1 capsule (50 mg total) by mouth 2 (two) times daily with a meal. Patient not taking: Reported on 08/31/2017 06/05/17   Caccavale, Sophia, PA-C  meclizine (ANTIVERT) 25 MG tablet Take 1 tablet (25 mg total) by mouth 3 (three) times daily as needed for dizziness. Patient not taking: Reported on 08/31/2017 09/06/15   Raeford RazorKohut, Stephen, MD    No Known Allergies  Social History  Substance Use Topics  . Smoking status: Never Smoker  . Smokeless tobacco: Never Used  . Alcohol use No    Family History  Problem Relation Age of Onset  . Diabetes Mother   . Hypertension Mother   . COPD Father   . Heart disease Father        CHF      Objective:  Constitutional: in no apparent distress and alert,  Vitals:   08/31/17 1041  BP: (!) 159/89  Pulse: (!) 53  Temp: 98.1 F (36.7 C)   Eyes: anicteric Cardiovascular: Cor RRR Respiratory: CTA B; normal respiratory effort Gastrointestinal: Bowel sounds are normal, liver is not enlarged, spleen is not enlarged Musculoskeletal: no pedal edema noted Skin: negatives: no rash; no porphyria cutanea tarda Lymphatic: no cervical lymphadenopathy   Laboratory Genotype: No results found for: HCVGENOTYPE HCV viral load: No results found for: HCVQUANT Lab Results  Component Value Date   WBC 6.3 01/30/2017   HGB 13.7 01/30/2017   HCT 41.1 01/30/2017   MCV 92.3  01/30/2017   PLT 188.0 01/30/2017    Lab Results  Component Value Date   CREATININE 1.03 08/04/2017   BUN 14 08/04/2017   NA 140 08/04/2017   K 4.0 08/04/2017   CL 106 08/04/2017   CO2 25 08/04/2017    Lab Results  Component Value Date   ALT 65 (H) 08/04/2017   AST 50 (H) 08/04/2017   ALKPHOS 57 08/04/2017     Labs and history reviewed and show CHILD-PUGH A  5-6 points: Child class A 7-9 points: Child class B 10-15 points: Child class C  Lab Results  Component Value Date   BILITOT 0.4  08/04/2017   ALBUMIN 3.8 08/04/2017     Assessment: New Patient with Chronic Hepatitis C genotype unknown, untreated.  I discussed with the patient the lab findings that confirm chronic hepatitis C as well as the natural history and progression of disease including about 30% of people who develop cirrhosis of the liver if left untreated and once cirrhosis is established there is a 2-7% risk per year of liver cancer and liver failure.  I discussed the importance of treatment and benefits in reducing the risk, even if significant liver fibrosis exists.   Plan: 1) Patient counseled extensively on limiting acetaminophen to no more than 2 grams daily, avoidance of alcohol. 2) Transmission discussed with patient including sexual transmission, sharing razors and toothbrush.   3) Will need referral to gastroenterology if concern for cirrhosis 4) Will need referral for substance abuse counseling: No.; Further work up to include urine drug screen  No. 5) Will prescribe appropriate medication based on genotype and coverage  6) Hepatitis A and B titers 7) Pneumovax vaccine if concern for cirrhosis 9) Further work up to include liver staging with elastography 10) will follow up after starting medication

## 2017-08-31 NOTE — Patient Instructions (Signed)
Date 08/31/17  Dear [name], As discussed in the ID Clinic, your hepatitis C therapy will include highly effective medication(s) for treatment and will vary based on the type of hepatitis C and insurance approval.  Potential medications include:          Harvoni (sofosbuvir 90mg /ledipasvir 400mg ) tablet oral daily          OR     Epclusa (sofosbuvir 400mg /velpatasvir 100mg ) tablet oral daily          OR      Mavyret (glecaprevir 100 mg/pibrentasvir 40 mg): Take 3 tablets oral daily          OR     Zepatier (elbasvir 50 mg/grazoprevir 100 mg) oral daily, +/- ribavirin              Medications are typically for 8 or 12 weeks total ---------------------------------------------------------------- Your HCV Treatment Start Date: You will be notified by our office once the medication is approved and where you can pick it up (or if mailed)   ---------------------------------------------------------------- YOUR PHARMACY CONTACT:   [facility] [address] [place], [place] [address] Phone: [phone] Hours: Monday to Friday 7:30 am to 6:00 pm   Please always contact your pharmacy at least 3-4 business days before you run out of medications to ensure your next month's medication is ready or 1 week prior to running out if you receive it by mail.  Remember, each prescription is for 28 days. ---------------------------------------------------------------- GENERAL NOTES REGARDING YOUR HEPATITIS C MEDICATION:  Some medications have the following interactions:  - Acid reducing agents such as H2 blockers (ie. Pepcid (famotidine), Zantac (ranitidine), Tagamet (cimetidine), Axid (nizatidine) and proton pump inhibitors (ie. Prilosec (omeprazole), Protonix (pantoprazole), Nexium (esomeprazole), or Aciphex (rabeprazole)). Do not take until you have discussed with a health care provider.    -Antacids that contain magnesium and/or aluminum hydroxide (ie. Milk of Magensia,  Rolaids, Gaviscon, Maalox, Mylanta, an dArthritis Pain Formula).  -Calcium carbonate (calcium supplements or antacids such as Tums, Caltrate, Os-Cal).  -St. John's wort or any products that contain St. John's wort like some herbal supplements  Please inform the office prior to starting any of these medications.  - The common side effects associated with Harvoni include:      1. Fatigue      2. Headache      3. Nausea      4. Diarrhea      5. Insomnia  Please note that this only lists the most common side effects and is NOT a comprehensive list of the potential side effects of these medications. For more information, please review the drug information sheets that come with your medication package from the pharmacy.  ---------------------------------------------------------------- GENERAL HELPFUL HINTS ON HCV THERAPY: 1. Stay well-hydrated. 2. Notify the ID Clinic of any changes in your other over-the-counter/herbal or prescription medications. 3. If you miss a dose of your medication, take the missed dose as soon as you remember. Return to your regular time/dose schedule the next day.  4.  Do not stop taking your medications without first talking with your healthcare provider. 5.  You may take Tylenol (acetaminophen), as long as the dose is less than 2000 mg (OR no more than 4 tablets of the Tylenol Extra Strengths 500mg  tablet) in 24 hours. 6.  You will see our pharmacist-specialist within the first 2 weeks of starting your medication to monitor for any possible side effects. 7.  You will have labs once during treatment, soon  after treatment completion and one final lab 6 months after treatment completion to verify the virus is out of your system.  Scharlene Gloss, [place] for [place], [place]  [phone]

## 2017-09-01 LAB — CBC WITH DIFFERENTIAL/PLATELET
BASOS ABS: 70 {cells}/uL (ref 0–200)
Basophils Relative: 1.4 %
EOS PCT: 8.7 %
Eosinophils Absolute: 435 cells/uL (ref 15–500)
HEMATOCRIT: 42.5 % (ref 38.5–50.0)
Hemoglobin: 14.2 g/dL (ref 13.2–17.1)
Lymphs Abs: 2115 cells/uL (ref 850–3900)
MCH: 29.9 pg (ref 27.0–33.0)
MCHC: 33.4 g/dL (ref 32.0–36.0)
MCV: 89.5 fL (ref 80.0–100.0)
MPV: 11.8 fL (ref 7.5–12.5)
Monocytes Relative: 9.5 %
NEUTROS PCT: 38.1 %
Neutro Abs: 1905 cells/uL (ref 1500–7800)
PLATELETS: 218 10*3/uL (ref 140–400)
RBC: 4.75 10*6/uL (ref 4.20–5.80)
RDW: 12.7 % (ref 11.0–15.0)
TOTAL LYMPHOCYTE: 42.3 %
WBC: 5 10*3/uL (ref 3.8–10.8)
WBCMIX: 475 {cells}/uL (ref 200–950)

## 2017-09-01 LAB — COMPLETE METABOLIC PANEL WITH GFR
AG Ratio: 1 (calc) (ref 1.0–2.5)
ALBUMIN MSPROF: 4 g/dL (ref 3.6–5.1)
ALT: 73 U/L — ABNORMAL HIGH (ref 9–46)
AST: 46 U/L — ABNORMAL HIGH (ref 10–35)
Alkaline phosphatase (APISO): 65 U/L (ref 40–115)
BUN: 14 mg/dL (ref 7–25)
CHLORIDE: 105 mmol/L (ref 98–110)
CO2: 25 mmol/L (ref 20–32)
CREATININE: 1.09 mg/dL (ref 0.70–1.25)
Calcium: 9.6 mg/dL (ref 8.6–10.3)
GFR, EST AFRICAN AMERICAN: 84 mL/min/{1.73_m2} (ref >=60)
GFR, EST NON AFRICAN AMERICAN: 72 mL/min/{1.73_m2} (ref >=60)
GLOBULIN: 4 g/dL — AB (ref 1.9–3.7)
GLUCOSE: 91 mg/dL (ref 65–99)
Potassium: 4.2 mmol/L (ref 3.5–5.3)
SODIUM: 140 mmol/L (ref 135–146)
TOTAL PROTEIN: 8 g/dL (ref 6.1–8.1)
Total Bilirubin: 0.5 mg/dL (ref 0.2–1.2)

## 2017-09-01 LAB — HEPATITIS B SURFACE ANTIGEN: HEP B S AG: NONREACTIVE

## 2017-09-01 LAB — HIV ANTIBODY (ROUTINE TESTING W REFLEX): HIV 1&2 Ab, 4th Generation: NONREACTIVE

## 2017-09-01 LAB — HEPATITIS B CORE ANTIBODY, TOTAL: HEP B C TOTAL AB: REACTIVE — AB

## 2017-09-01 LAB — PROTIME-INR
INR: 1
Prothrombin Time: 11 s (ref 9.0–11.5)

## 2017-09-01 LAB — HEPATITIS B SURFACE ANTIBODY,QUALITATIVE: HEP B S AB: NONREACTIVE

## 2017-09-01 LAB — HEPATITIS A ANTIBODY, TOTAL: Hepatitis A AB,Total: REACTIVE — AB

## 2017-09-04 ENCOUNTER — Other Ambulatory Visit: Payer: Self-pay | Admitting: Internal Medicine

## 2017-09-04 LAB — HEPATITIS C GENOTYPE

## 2017-09-04 MED ORDER — LEDIPASVIR-SOFOSBUVIR 90-400 MG PO TABS: 1.0000 | ORAL_TABLET | Freq: Every day | ORAL | 2 refills | Status: DC

## 2017-09-05 ENCOUNTER — Ambulatory Visit (HOSPITAL_COMMUNITY): Payer: 59

## 2017-09-07 ENCOUNTER — Ambulatory Visit (HOSPITAL_COMMUNITY)
Admission: RE | Admit: 2017-09-07 | Discharge: 2017-09-07 | Disposition: A | Discharge status: Home / Self Care | Payer: 59 | Source: Ambulatory Visit | Attending: Internal Medicine | Admitting: Internal Medicine

## 2017-09-07 ENCOUNTER — Telehealth: Payer: Self-pay | Admitting: *Deleted

## 2017-09-07 ENCOUNTER — Other Ambulatory Visit: Payer: Self-pay | Admitting: Internal Medicine

## 2017-09-07 DIAGNOSIS — K769 Liver disease, unspecified: Secondary | ICD-10-CM

## 2017-09-07 DIAGNOSIS — R160 Hepatomegaly, not elsewhere classified: Secondary | ICD-10-CM

## 2017-09-07 DIAGNOSIS — B182 Chronic viral hepatitis C: Secondary | ICD-10-CM

## 2017-09-07 DIAGNOSIS — K7689 Other specified diseases of liver: Secondary | ICD-10-CM | POA: Insufficient documentation

## 2017-09-07 LAB — LIVER FIBROSIS, FIBROTEST-ACTITEST
ALT: 67 U/L — ABNORMAL HIGH (ref 9–46)
APOLIPOPROTEIN A1: 176 mg/dL (ref 94–176)
Alpha-2-Macroglobulin: 389 mg/dL — ABNORMAL HIGH (ref 106–279)
BILIRUBIN: 0.2 mg/dL (ref 0.2–1.2)
Fibrosis Score: 0.57
GGT: 89 U/L — AB (ref 3–70)
HAPTOGLOBIN: 59 mg/dL (ref 43–212)
Necroinflammat ACT Score: 0.5
Reference ID: 2171853

## 2017-09-07 NOTE — Telephone Encounter (Signed)
-----   Message from Gardiner Barefootobert W Comer, MD sent at 09/07/2017 11:20 AM EDT ----- Please have him get an MRI follow up of his ultrasound.  Findings look like a benign cyst but recommended an MRI for confirmation.   Thanks Order is in

## 2017-09-07 NOTE — Telephone Encounter (Signed)
Patient notified and scheduled for MRI on 09/14/17 at 7:45 [facility]. Nothing to eat or drink after midnight. Manuel MolaJacqueline Zakiah Beckerman

## 2017-09-12 ENCOUNTER — Other Ambulatory Visit: Payer: Self-pay | Admitting: Pharmacist

## 2017-09-14 ENCOUNTER — Telehealth: Payer: Self-pay | Admitting: *Deleted

## 2017-09-14 ENCOUNTER — Ambulatory Visit (HOSPITAL_COMMUNITY)
Admission: RE | Admit: 2017-09-14 | Discharge: 2017-09-14 | Disposition: A | Discharge status: Home / Self Care | Payer: 59 | Source: Ambulatory Visit | Attending: Internal Medicine | Admitting: Internal Medicine

## 2017-09-14 DIAGNOSIS — K769 Liver disease, unspecified: Secondary | ICD-10-CM | POA: Diagnosis not present

## 2017-09-14 DIAGNOSIS — K7689 Other specified diseases of liver: Secondary | ICD-10-CM | POA: Diagnosis not present

## 2017-09-14 MED ORDER — GADOBENATE DIMEGLUMINE 529 MG/ML IV SOLN
20.0000 mL | Freq: Once | INTRAVENOUS | Status: AC | PRN
  Administered 2017-09-14: 16 mL via INTRAVENOUS

## 2017-09-14 NOTE — Telephone Encounter (Signed)
-----   Message from Gardiner Barefootobert W Comer, MD sent at 09/14/2017 10:53 AM EDT ----- Please let him know his MRI looks fine, confirmed the findings on the ultrasound were small cysts, no masses or concerns found.  Thanks

## 2017-09-14 NOTE — Telephone Encounter (Signed)
Called patient, left message asking him to return the call regarding MRI results, that things looked fine per Dr Luciana Axeomer. Andree CossHowell, Toron Bowring M, RN

## 2017-09-15 ENCOUNTER — Telehealth: Payer: Self-pay | Admitting: *Deleted

## 2017-09-15 NOTE — Telephone Encounter (Signed)
Patient called and wants the results of his MRI from yesterday 09/14/17. Advised will have provider read and someone will give a call once he does.

## 2017-09-15 NOTE — Telephone Encounter (Signed)
[facility] phone note from yesterday from [facility]

## 2017-09-18 ENCOUNTER — Other Ambulatory Visit: Payer: Self-pay | Admitting: Pharmacist

## 2017-09-18 DIAGNOSIS — B182 Chronic viral hepatitis C: Secondary | ICD-10-CM

## 2017-09-18 MED ORDER — LEDIPASVIR-SOFOSBUVIR 90-400 MG PO TABS: 1.0000 | ORAL_TABLET | Freq: Every day | ORAL | 2 refills | Status: DC

## 2017-09-18 NOTE — Telephone Encounter (Signed)
Patient called back and advised of Dr. Ephriam Knucklesomer's message.

## 2017-09-19 ENCOUNTER — Other Ambulatory Visit: Payer: Self-pay | Admitting: Pharmacist

## 2017-09-25 ENCOUNTER — Telehealth: Payer: Self-pay | Admitting: Pharmacy Technician

## 2017-09-25 ENCOUNTER — Other Ambulatory Visit: Payer: Self-pay | Admitting: Pharmacist

## 2017-09-25 NOTE — Telephone Encounter (Signed)
[name] called stating [phone] reached out to him about us sending in the prescription for his Hep C medication.  I explained we have not due to his insurance denying him 12 weeks and approving only 8 weeks.  He needs the 12 weeks and once approved, I will call him and coordinated with the pharmacy.  He understands.

## 2017-10-10 ENCOUNTER — Other Ambulatory Visit: Payer: Self-pay | Admitting: Pharmacist

## 2017-10-10 DIAGNOSIS — B182 Chronic viral hepatitis C: Secondary | ICD-10-CM

## 2017-10-10 MED ORDER — SOFOSBUVIR-VELPATASVIR 400-100 MG PO TABS: 1.0000 | ORAL_TABLET | Freq: Every day | ORAL | 2 refills | Status: DC

## 2017-10-13 ENCOUNTER — Other Ambulatory Visit: Payer: Self-pay | Admitting: Pharmacist Clinician (PhC)/ Clinical Pharmacy Specialist

## 2017-10-13 DIAGNOSIS — B182 Chronic viral hepatitis C: Secondary | ICD-10-CM

## 2017-10-13 MED ORDER — SOFOSBUVIR-VELPATASVIR 400-100 MG PO TABS: 1.0000 | ORAL_TABLET | Freq: Every day | ORAL | 2 refills | Status: DC

## 2017-10-13 MED ORDER — LEDIPASVIR-SOFOSBUVIR 90-400 MG PO TABS: 1.0000 | ORAL_TABLET | Freq: Every day | ORAL | 2 refills | Status: DC

## 2017-10-13 NOTE — Progress Notes (Signed)
Manuel Berger has to be tx to [facility]. Sent to the one in [place].

## 2017-10-19 ENCOUNTER — Encounter: Payer: Self-pay | Admitting: Pharmacy Technician

## 2017-11-08 ENCOUNTER — Ambulatory Visit: Payer: 59

## 2017-11-16 ENCOUNTER — Ambulatory Visit (INDEPENDENT_AMBULATORY_CARE_PROVIDER_SITE_OTHER): Payer: 59 | Admitting: Pharmacist

## 2017-11-16 DIAGNOSIS — B182 Chronic viral hepatitis C: Secondary | ICD-10-CM

## 2017-11-16 NOTE — Progress Notes (Signed)
[facility] Pharmacy Visit  HPI: Manuel Berger is a 62 y.o. male who presents to the [facility] pharmacy clinic today for Hep C follow-up. He has genotype 1b, F3/F4, and started 12 wks of Epclusa ~12/6.  Patient Active Problem List   Diagnosis Date Noted  . Liver mass 09/07/2017  . Great toe pain 06/07/2017  . Abnormal TSH 02/02/2017  . Bilateral ganglion cysts of wrists 01/25/2016  . Bilateral knee pain 10/30/2014  . Lower back pain 07/25/2013  . Right ankle sprain 03/24/2011  . Hyperglycemia 03/23/2011  . Preventative health care 03/23/2011  . OSTEOARTHRITIS, KNEES, BILATERAL 04/30/2008  . BURSITIS, LEFT HIP 04/30/2008  . Chronic hepatitis C without hepatic coma (HCC) 12/05/2007  . Hyperlipidemia 12/05/2007  . Allergic rhinitis 12/05/2007      Medication List        Accurate as of 11/16/17 11:21 AM. Always use your most recent med list.          aspirin 81 MG EC tablet   fexofenadine 180 MG tablet Commonly known as:  ALLEGRA Take 1 tablet (180 mg total) by mouth daily.   indomethacin 50 MG capsule Commonly known as:  INDOCIN Take 1 capsule (50 mg total) by mouth 2 (two) times daily with a meal.   meclizine 25 MG tablet Commonly known as:  ANTIVERT Take 1 tablet (25 mg total) by mouth 3 (three) times daily as needed for dizziness.   naproxen 500 MG tablet Commonly known as:  NAPROSYN Take 1 tablet (500 mg total) by mouth 2 (two) times daily.   Sofosbuvir-Velpatasvir 400-100 MG Tabs Commonly known as:  EPCLUSA Take 1 tablet by mouth daily.       Allergies: No Known Allergies  Past Medical History: Past Medical History:  Diagnosis Date  . ALLERGIC RHINITIS 12/05/2007  . BURSITIS, LEFT HIP 04/30/2008  . GLUCOSE INTOLERANCE 12/05/2007  . HEPATITIS C 12/05/2007  . HYPERLIPIDEMIA 12/05/2007  . Impaired glucose tolerance 03/23/2011  . KNEE PAIN, BILATERAL 03/09/2010  . OSTEOARTHRITIS, KNEES, BILATERAL 04/30/2008    Social  History: Social History   Socioeconomic History  . Marital status: Married    Spouse name: Not on file  . Number of children: 4  . Years of education: Not on file  . Highest education level: Not on file  Social Needs  . Financial resource strain: Not on file  . Food insecurity - worry: Not on file  . Food insecurity - inability: Not on file  . Transportation needs - medical: Not on file  . Transportation needs - non-medical: Not on file  Occupational History  . Occupation: Market researcher fro [employer]  Tobacco Use  . Smoking status: Never Smoker  . Smokeless tobacco: Never Used  Substance and Sexual Activity  . Alcohol use: No  . Drug use: Not on file  . Sexual activity: Not on file  Other Topics Concern  . Not on file  Social History Narrative  . Not on file    Labs: Hep B S Ab (no units)  Date Value  08/31/2017 NON-REACTIVE   Hepatitis B Surface Ag (no units)  Date Value  08/31/2017 NON-REACTIVE   HCV Ab (no units)  Date Value  03/04/2010 REACTIVE (A)    Lab Results  Component Value Date   HCVGENOTYPE 1b 08/31/2017    Hepatitis C RNA quantitative Latest Ref Rng & Units 08/04/2017  HCV Quantitative Log NOT DETECT Log IU/mL 6.61(H)    AST (U/L)  Date  Value  08/31/2017 46 (H)  08/04/2017 50 (H)  01/30/2017 57 (H)   ALT (U/L)  Date Value  08/31/2017 73 (H)  08/31/2017 67 (H)  08/04/2017 65 (H)  01/30/2017 82 (H)   INR (no units)  Date Value  08/31/2017 1.0    Fibrosis Score: F3/F4 as assessed by [facility]   Child-Pugh Score: A  Assessment: Manuel Berger is here today to follow-up for his Hep C infection.  He started 12 weeks of Epclusa for his 1b virus a little under a month ago.  He is tolerating it quite well with no complaints or side effects. He states he feels great.  He has missed no doses and takes it every day around 6pm. He pulled out a zip lock bag that had one pill in it and he states he always carries it around with him in case he is out of  the house around 6pm.  I gave him a pill box keychain to carry the medication in instead of the zip lock bag and he was very grateful. He is doing very well on therapy. He is having no issues getting his Epclusa from his pharmacy and he already has his second bottle.  I explained our process here including labs, follow-up, and his fibrosis score. I will get a HCV viral load today and have him come back and see us at the end of therapy.  He will see Dr. Luciana Axeomer for his cure visits.    Plan: - Continue Epclusa x 12 wks - HCV RNA today - F/u with me again 01/18/18 at 1030am  Chenel Wernli L. Jeremie Abdelaziz, PharmD, AAHIVP, CPP Infectious Diseases Clinical Pharmacist [facility] 11/16/2017, 11:21 AM

## 2017-11-16 NOTE — Addendum Note (Signed)
Addended by: Aggie CosierKUPPELWEISER, Kymari Nuon L on: 11/16/2017 11:28 AM   Modules accepted: Orders

## 2017-11-18 LAB — HEPATITIS C RNA QUANTITATIVE
HCV Quantitative Log: <1.18 Log IU/mL
HCV RNA, PCR, QN: NOT DETECTED [IU]/mL

## 2018-01-18 ENCOUNTER — Ambulatory Visit (INDEPENDENT_AMBULATORY_CARE_PROVIDER_SITE_OTHER): Payer: 59 | Admitting: Pharmacist

## 2018-01-18 DIAGNOSIS — B182 Chronic viral hepatitis C: Secondary | ICD-10-CM | POA: Diagnosis not present

## 2018-01-18 LAB — COMPREHENSIVE METABOLIC PANEL
AG RATIO: 1 (calc) (ref 1.0–2.5)
ALKALINE PHOSPHATASE (APISO): 70 U/L (ref 40–115)
ALT: 32 U/L (ref 9–46)
AST: 29 U/L (ref 10–35)
Albumin: 3.9 g/dL (ref 3.6–5.1)
BUN: 14 mg/dL (ref 7–25)
CALCIUM: 9.3 mg/dL (ref 8.6–10.3)
CO2: 28 mmol/L (ref 20–32)
Chloride: 107 mmol/L (ref 98–110)
Creat: 1.1 mg/dL (ref 0.70–1.25)
GLOBULIN: 3.8 g/dL — AB (ref 1.9–3.7)
GLUCOSE: 104 mg/dL — AB (ref 65–99)
Potassium: 4.5 mmol/L (ref 3.5–5.3)
Sodium: 141 mmol/L (ref 135–146)
Total Bilirubin: 0.5 mg/dL (ref 0.2–1.2)
Total Protein: 7.7 g/dL (ref 6.1–8.1)

## 2018-01-18 NOTE — Progress Notes (Signed)
[facility] Pharmacy Visit  HPI: Manuel Berger is a 62 y.o. male who [facility] for Hep C follow-up.  He has genotype 1b, F3/F4 with CP class A, and finished 12 weeks of Epclusa around the beginning of March.  Patient Active Problem List   Diagnosis Date Noted  . Liver mass 09/07/2017  . Great toe pain 06/07/2017  . Abnormal TSH 02/02/2017  . Bilateral ganglion cysts of wrists 01/25/2016  . Bilateral knee pain 10/30/2014  . Lower back pain 07/25/2013  . Right ankle sprain 03/24/2011  . Hyperglycemia 03/23/2011  . Preventative health care 03/23/2011  . OSTEOARTHRITIS, KNEES, BILATERAL 04/30/2008  . BURSITIS, LEFT HIP 04/30/2008  . Chronic hepatitis C without hepatic coma (HCC) 12/05/2007  . Hyperlipidemia 12/05/2007  . Allergic rhinitis 12/05/2007    Patient's Medications  New Prescriptions   No medications on file  Previous Medications   SOFOSBUVIR-VELPATASVIR (EPCLUSA) 400-100 MG TABS    Take 1 tablet by mouth daily.  Modified Medications   No medications on file  Discontinued Medications   No medications on file    Allergies: No Known Allergies  Past Medical History: Past Medical History:  Diagnosis Date  . ALLERGIC RHINITIS 12/05/2007  . BURSITIS, LEFT HIP 04/30/2008  . GLUCOSE INTOLERANCE 12/05/2007  . HEPATITIS C 12/05/2007  . HYPERLIPIDEMIA 12/05/2007  . Impaired glucose tolerance 03/23/2011  . KNEE PAIN, BILATERAL 03/09/2010  . OSTEOARTHRITIS, KNEES, BILATERAL 04/30/2008    Social History: Social History   Socioeconomic History  . Marital status: Married    Spouse name: Not on file  . Number of children: 4  . Years of education: Not on file  . Highest education level: Not on file  Social Needs  . Financial resource strain: Not on file  . Food insecurity - worry: Not on file  . Food insecurity - inability: Not on file  . Transportation needs - medical: Not on file  . Transportation needs -  non-medical: Not on file  Occupational History  . 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 Smoking status: Never Smoker  . Smokeless tobacco: Never Used  Substance and Sexual Activity  . Alcohol use: No  . Drug use: Not on file  . Sexual activity: Not on file  Other Topics Concern  . Not on file  Social History Narrative  . Not on file    Labs: Hep B S Ab (no units)  Date Value  08/31/2017 NON-REACTIVE   Hepatitis B Surface Ag (no units)  Date Value  08/31/2017 NON-REACTIVE   HCV Ab (no units)  Date Value  03/04/2010 REACTIVE (A)    Lab Results  Component Value Date   HCVGENOTYPE 1b 08/31/2017    Hepatitis C RNA quantitative Latest Ref Rng & Units 11/16/2017 08/04/2017  HCV Quantitative Log NOT DETECT Log IU/mL <1.18 NOT DETECTED 6.61(H)    AST (U/L)  Date Value  08/31/2017 46 (H)  08/04/2017 50 (H)  01/30/2017 57 (H)   ALT (U/L)  Date Value  08/31/2017 73 (H)  08/31/2017 67 (H)  08/04/2017 65 (H)  01/30/2017 82 (H)   INR (no units)  Date Value  08/31/2017 1.0    Fibrosis Score: F3/F4 as assessed by elastography   Child-Pugh Score: A  Assessment: Manuel Berger is here today to follow-up for his Hep C infection. He completed 12 weeks of Epclusa back at the beginning of March.  He missed no doses and had  no issues tolerating the medication.  His early on treatment viral load back in January was already undetectable.  I will check another viral load today and have him see Dr. Luciana Axe in 2-3 months for [facility] since he is F4.    Plan: - Completed Epclusa x 12 weeks - HCV RNA today - F/u with Dr. Luciana Axe 5/30 at 1130am  Cassie L. Kuppelweiser, PharmD, AAHIVP, CPP Infectious Diseases Clinical Pharmacist [facility] 01/18/2018, 3:26 PM

## 2018-01-20 LAB — HEPATITIS C RNA QUANTITATIVE
HCV QUANT LOG: NOT DETECTED {Log_IU}/mL
HCV RNA, PCR, QN: NOT DETECTED [IU]/mL

## 2018-01-22 ENCOUNTER — Telehealth: Payer: Self-pay | Admitting: Pharmacist

## 2018-01-22 NOTE — Telephone Encounter (Signed)
Called Manuel Berger to let him know that his EOT Hep C viral load was still undetectable.  He will follow up with Dr. Luciana Axeomer [facility].

## 2018-02-13 ENCOUNTER — Other Ambulatory Visit (INDEPENDENT_AMBULATORY_CARE_PROVIDER_SITE_OTHER): Payer: 59

## 2018-02-13 DIAGNOSIS — Z0000 Encounter for general adult medical examination without abnormal findings: Secondary | ICD-10-CM

## 2018-02-13 DIAGNOSIS — R739 Hyperglycemia, unspecified: Secondary | ICD-10-CM

## 2018-02-13 LAB — HEPATIC FUNCTION PANEL
ALBUMIN: 3.6 g/dL (ref 3.5–5.2)
ALT: 33 U/L (ref 0–53)
AST: 31 U/L (ref 0–37)
Alkaline Phosphatase: 65 U/L (ref 39–117)
Bilirubin, Direct: 0.1 mg/dL (ref 0.0–0.3)
TOTAL PROTEIN: 7.4 g/dL (ref 6.0–8.3)
Total Bilirubin: 0.6 mg/dL (ref 0.2–1.2)

## 2018-02-13 LAB — BASIC METABOLIC PANEL
BUN: 13 mg/dL (ref 6–23)
CO2: 28 mEq/L (ref 19–32)
Calcium: 9.1 mg/dL (ref 8.4–10.5)
Chloride: 106 mEq/L (ref 96–112)
Creatinine, Ser: 1.05 mg/dL (ref 0.40–1.50)
GFR: 91.74 mL/min (ref >60.00)
Glucose, Bld: 100 mg/dL — ABNORMAL HIGH (ref 70–99)
POTASSIUM: 4.4 meq/L (ref 3.5–5.1)
SODIUM: 140 meq/L (ref 135–145)

## 2018-02-13 LAB — URINALYSIS, ROUTINE W REFLEX MICROSCOPIC
BILIRUBIN URINE: NEGATIVE
Hgb urine dipstick: NEGATIVE
Ketones, ur: NEGATIVE
Leukocytes, UA: NEGATIVE
Nitrite: NEGATIVE
PH: 6 (ref 5.0–8.0)
SPECIFIC GRAVITY, URINE: 1.02 (ref 1.000–1.030)
TOTAL PROTEIN, URINE-UPE24: NEGATIVE
UROBILINOGEN UA: 0.2 (ref 0.0–1.0)
Urine Glucose: NEGATIVE

## 2018-02-13 LAB — LIPID PANEL
CHOLESTEROL: 190 mg/dL (ref 0–200)
HDL: 59.1 mg/dL (ref >39.00)
LDL CALC: 117 mg/dL — AB (ref 0–99)
NonHDL: 130.51
TRIGLYCERIDES: 67 mg/dL (ref 0.0–149.0)
Total CHOL/HDL Ratio: 3
VLDL: 13.4 mg/dL (ref 0.0–40.0)

## 2018-02-13 LAB — CBC WITH DIFFERENTIAL/PLATELET
BASOS PCT: 1.1 % (ref 0.0–3.0)
Basophils Absolute: 0.1 10*3/uL (ref 0.0–0.1)
EOS ABS: 0.7 10*3/uL (ref 0.0–0.7)
EOS PCT: 10.7 % — AB (ref 0.0–5.0)
HEMATOCRIT: 40.9 % (ref 39.0–52.0)
HEMOGLOBIN: 13.7 g/dL (ref 13.0–17.0)
LYMPHS PCT: 42.3 % (ref 12.0–46.0)
Lymphs Abs: 2.7 10*3/uL (ref 0.7–4.0)
MCHC: 33.5 g/dL (ref 30.0–36.0)
MCV: 92.9 fl (ref 78.0–100.0)
MONOS PCT: 9.4 % (ref 3.0–12.0)
Monocytes Absolute: 0.6 10*3/uL (ref 0.1–1.0)
NEUTROS ABS: 2.3 10*3/uL (ref 1.4–7.7)
Neutrophils Relative %: 36.5 % — ABNORMAL LOW (ref 43.0–77.0)
PLATELETS: 185 10*3/uL (ref 150.0–400.0)
RBC: 4.4 Mil/uL (ref 4.22–5.81)
RDW: 14 % (ref 11.5–15.5)
WBC: 6.3 10*3/uL (ref 4.0–10.5)

## 2018-02-13 LAB — PSA: PSA: 1.26 ng/mL (ref 0.10–4.00)

## 2018-02-13 LAB — HEMOGLOBIN A1C: Hgb A1c MFr Bld: 6 % (ref 4.6–6.5)

## 2018-02-13 LAB — TSH: TSH: 3.47 u[IU]/mL (ref 0.35–4.50)

## 2018-02-15 ENCOUNTER — Ambulatory Visit: Payer: 59 | Admitting: Internal Medicine

## 2018-02-20 ENCOUNTER — Emergency Department (HOSPITAL_COMMUNITY): Admission: EM | Admit: 2018-02-20 | Discharge: 2018-02-20 | Discharge status: Against Medical Advice | Payer: 59

## 2018-02-21 DIAGNOSIS — M25562 Pain in left knee: Secondary | ICD-10-CM | POA: Diagnosis not present

## 2018-02-21 DIAGNOSIS — M25561 Pain in right knee: Secondary | ICD-10-CM | POA: Diagnosis not present

## 2018-02-28 ENCOUNTER — Ambulatory Visit (INDEPENDENT_AMBULATORY_CARE_PROVIDER_SITE_OTHER): Payer: 59 | Admitting: Internal Medicine

## 2018-02-28 ENCOUNTER — Encounter: Payer: Self-pay | Admitting: Internal Medicine

## 2018-02-28 VITALS — BP 128/86 | HR 78 | Temp 98.5°F | Ht 66.0 in | Wt 182.0 lb

## 2018-02-28 DIAGNOSIS — Z0000 Encounter for general adult medical examination without abnormal findings: Secondary | ICD-10-CM

## 2018-02-28 DIAGNOSIS — R739 Hyperglycemia, unspecified: Secondary | ICD-10-CM | POA: Diagnosis not present

## 2018-02-28 DIAGNOSIS — E785 Hyperlipidemia, unspecified: Secondary | ICD-10-CM | POA: Diagnosis not present

## 2018-02-28 NOTE — Assessment & Plan Note (Signed)
Lab Results  Component Value Date   HGBA1C 6.0 02/13/2018  stable overall by history and exam, recent data reviewed with pt, and pt to continue medical treatment as before,  to f/u any worsening symptoms or concerns

## 2018-02-28 NOTE — Progress Notes (Signed)
Subjective:    Patient ID: Manuel Berger, male    DOB: August 26, 1956, 62 y.o.   MRN: [id number]  HPI  Here for wellness and f/u;  Overall doing ok;  Pt denies Chest pain, worsening SOB, DOE, wheezing, orthopnea, PND, worsening LE edema, palpitations, dizziness or syncope.  Pt denies neurological change such as new headache, facial or extremity weakness.  Pt denies polydipsia, polyuria, or low sugar symptoms. Pt states overall good compliance with treatment and medications, good tolerability, and has been trying to follow appropriate diet.  Pt denies worsening depressive symptoms, suicidal ideation or panic. No fever, night sweats, wt loss, loss of appetite, or other constitutional symptoms.  Pt states good ability with ADL's, has low fall risk, home safety reviewed and adequate, no other significant changes in hearing or vision, and occasionally active with exercise.  Has f/u with ID appt next month.  Past Medical History:  Diagnosis Date  . ALLERGIC RHINITIS 12/05/2007  . BURSITIS, LEFT HIP 04/30/2008  . GLUCOSE INTOLERANCE 12/05/2007  . HEPATITIS C 12/05/2007  . HYPERLIPIDEMIA 12/05/2007  . Impaired glucose tolerance 03/23/2011  . KNEE PAIN, BILATERAL 03/09/2010  . OSTEOARTHRITIS, KNEES, BILATERAL 04/30/2008   No past surgical history on file.  reports that he has never smoked. He has never used smokeless tobacco. He reports that he does not drink alcohol. His drug history is not on file. family history includes COPD in his father; Diabetes in his mother; Heart disease in his father; Hypertension in his mother. No Known Allergies Current Outpatient Medications on File Prior to Visit  Medication Sig Dispense Refill  . Sofosbuvir-Velpatasvir (EPCLUSA) 400-100 MG TABS Take 1 tablet by mouth daily. 28 tablet 2   No current facility-administered medications on file prior to visit.    Review of Systems Constitutional: Negative for other unusual diaphoresis, sweats, appetite or weight changes HENT:  Negative for other worsening hearing loss, ear pain, facial swelling, mouth sores or neck stiffness.   Eyes: Negative for other worsening pain, redness or other visual disturbance.  Respiratory: Negative for other stridor or swelling Cardiovascular: Negative for other palpitations or other chest pain  Gastrointestinal: Negative for worsening diarrhea or loose stools, blood in stool, distention or other pain Genitourinary: Negative for hematuria, flank pain or other change in urine volume.  Musculoskeletal: Negative for myalgias or other joint swelling.  Skin: Negative for other color change, or other wound or worsening drainage.  Neurological: Negative for other syncope or numbness. Hematological: Negative for other adenopathy or swelling Psychiatric/Behavioral: Negative for hallucinations, other worsening agitation, SI, self-injury, or new decreased concentration All other system neg per pt    Objective:   Physical Exam BP 128/86   Pulse 78   Temp 98.5 F (36.9 C) (Oral)   Ht 5\' 6"  (1.676 m)   Wt 182 lb (82.6 kg)   SpO2 96%   BMI 29.38 kg/m  VS noted,  Constitutional: Pt is oriented to person, place, and time. Appears well-developed and well-nourished, in no significant distress and comfortable Head: Normocephalic and atraumatic  Eyes: Conjunctivae and EOM are normal. Pupils are equal, round, and reactive to light Right Ear: External ear normal without discharge Left Ear: External ear normal without discharge Nose: Nose without discharge or deformity Mouth/Throat: Oropharynx is without other ulcerations and moist  Neck: Normal range of motion. Neck supple. No JVD present. No tracheal deviation present or significant neck LA or mass Cardiovascular: Normal rate, regular rhythm, normal heart sounds and intact distal pulses.  Pulmonary/Chest: WOB normal and breath sounds without rales or wheezing  Abdominal: Soft. Bowel sounds are normal. NT. No HSM  Musculoskeletal: Normal range of  motion. Exhibits no edema Lymphadenopathy: Has no other cervical adenopathy.  Neurological: Pt is alert and oriented to person, place, and time. Pt has normal reflexes. No cranial nerve deficit. Motor grossly intact, Gait intact Skin: Skin is warm and dry. No rash noted or new ulcerations Psychiatric:  Has normal mood and affect. Behavior is normal without agitation No other exam findings    Assessment & Plan:

## 2018-02-28 NOTE — Patient Instructions (Addendum)
Please continue all other medications as before, and refills have been done if requested.  Please have the pharmacy call with any other refills you may need.  Please continue your efforts at being more active, low cholesterol diet, and weight control.  You are otherwise up to date with prevention measures today.  Please keep your appointments with your specialists as you may have planned  Please return in 1 year for your yearly visit, or sooner if needed, with Lab testing done 3-5 days before  

## 2018-02-28 NOTE — Assessment & Plan Note (Signed)
Declines statin 

## 2018-02-28 NOTE — Assessment & Plan Note (Signed)

## 2018-03-28 DIAGNOSIS — M25562 Pain in left knee: Secondary | ICD-10-CM | POA: Diagnosis not present

## 2018-04-12 ENCOUNTER — Encounter: Payer: Self-pay | Admitting: Internal Medicine

## 2018-04-12 ENCOUNTER — Ambulatory Visit (INDEPENDENT_AMBULATORY_CARE_PROVIDER_SITE_OTHER): Payer: 59 | Admitting: Internal Medicine

## 2018-04-12 VITALS — BP 156/93 | HR 80 | Temp 98.5°F | Ht 66.0 in | Wt 185.0 lb

## 2018-04-12 DIAGNOSIS — B182 Chronic viral hepatitis C: Secondary | ICD-10-CM

## 2018-04-12 DIAGNOSIS — K7460 Unspecified cirrhosis of liver: Secondary | ICD-10-CM | POA: Insufficient documentation

## 2018-04-12 DIAGNOSIS — Z7189 Other specified counseling: Secondary | ICD-10-CM | POA: Diagnosis not present

## 2018-04-12 DIAGNOSIS — K7400 Hepatic fibrosis, unspecified: Secondary | ICD-10-CM

## 2018-04-12 DIAGNOSIS — Z7185 Encounter for immunization safety counseling: Secondary | ICD-10-CM | POA: Insufficient documentation

## 2018-04-12 LAB — COMPLETE METABOLIC PANEL WITH GFR
AG RATIO: 1.1 (calc) (ref 1.0–2.5)
ALBUMIN MSPROF: 4.1 g/dL (ref 3.6–5.1)
ALT: 33 U/L (ref 9–46)
AST: 29 U/L (ref 10–35)
Alkaline phosphatase (APISO): 73 U/L (ref 40–115)
BUN: 10 mg/dL (ref 7–25)
CALCIUM: 9.2 mg/dL (ref 8.6–10.3)
CO2: 26 mmol/L (ref 20–32)
Chloride: 105 mmol/L (ref 98–110)
Creat: 1.05 mg/dL (ref 0.70–1.25)
GFR, EST AFRICAN AMERICAN: 87 mL/min/{1.73_m2} (ref >=60)
GFR, EST NON AFRICAN AMERICAN: 75 mL/min/{1.73_m2} (ref >=60)
GLUCOSE: 98 mg/dL (ref 65–99)
Globulin: 3.6 g/dL (calc) (ref 1.9–3.7)
Potassium: 4.1 mmol/L (ref 3.5–5.3)
Sodium: 140 mmol/L (ref 135–146)
TOTAL PROTEIN: 7.7 g/dL (ref 6.1–8.1)
Total Bilirubin: 0.6 mg/dL (ref 0.2–1.2)

## 2018-04-12 NOTE — Assessment & Plan Note (Signed)
Discussed pneuovax and will give today.

## 2018-04-12 NOTE — Progress Notes (Signed)
   Subjective:    Patient ID: Manuel Berger, male    DOB: [birth year], 62 y.o.   MRN: [id number]  HPI Here for follow up of chronic hepatitis C. Has genotype 1b, completed 12 weeks of Epclusa and feels well.  More energy now, exercising.  F3/4 on elastography, F2 on Fibrosure.  Platelets > 150,000.  No new issues.     Review of Systems  Constitutional: Negative for fatigue.  Skin: Negative for rash.  Neurological: Negative for headaches.       Objective:   Physical Exam  Constitutional: He appears well-developed and well-nourished. No distress.  HENT:  Mouth/Throat: No oropharyngeal exudate.  Eyes: No scleral icterus.  Cardiovascular: Normal rate, regular rhythm and normal heart sounds.  No murmur heard. Pulmonary/Chest: Effort normal and breath sounds normal. No respiratory distress.  Skin: No rash noted.   SH: no alcohol, no tobacco       Assessment & Plan:

## 2018-04-12 NOTE — Assessment & Plan Note (Signed)
Though low risk, I will continue with HCC screening.  With platelets > 150 and normal albumin, F2 Fibrosure and no cirrhosis on MRI, varices screening not indicated at this time.

## 2018-04-12 NOTE — Assessment & Plan Note (Signed)
SVR 12 today to confirm cure.   

## 2018-04-16 LAB — HEPATITIS C RNA QUANTITATIVE
HCV QUANT LOG: NOT DETECTED {Log_IU}/mL
HCV RNA, PCR, QN: NOT DETECTED [IU]/mL

## 2018-04-20 ENCOUNTER — Other Ambulatory Visit: Payer: 59

## 2018-05-03 ENCOUNTER — Ambulatory Visit
Admission: RE | Admit: 2018-05-03 | Discharge: 2018-05-03 | Disposition: A | Discharge status: Home / Self Care | Payer: 59 | Source: Ambulatory Visit | Attending: Internal Medicine | Admitting: Internal Medicine

## 2018-05-03 ENCOUNTER — Telehealth: Payer: Self-pay | Admitting: Behavioral Health

## 2018-05-03 DIAGNOSIS — B182 Chronic viral hepatitis C: Secondary | ICD-10-CM

## 2018-05-03 DIAGNOSIS — K7400 Hepatic fibrosis, unspecified: Secondary | ICD-10-CM

## 2018-05-03 NOTE — Telephone Encounter (Signed)
Called [name] and informed him per Dr. Luciana Axeomer that his ultrasound looked good with no new concerns.  Also let him know to have a repeat ultrasound in 6 months which is ordered and then follow up with Dr. Luciana Axeomer in one year.  [name] verbalized understanding. Angeline SlimAshley Atreus Hasz RN

## 2018-05-03 NOTE — Telephone Encounter (Signed)
-----   Message from Gardiner Barefootobert W Comer, MD sent at 05/03/2018 10:12 AM EDT ----- Please let him know his ultrasound looks good, no new concerns found.  Repeat in 6 months and with me in 1 year, as scheduled. thanks

## 2018-09-25 DIAGNOSIS — Z13220 Encounter for screening for lipoid disorders: Secondary | ICD-10-CM | POA: Diagnosis not present

## 2018-09-25 DIAGNOSIS — Z23 Encounter for immunization: Secondary | ICD-10-CM | POA: Diagnosis not present

## 2018-09-25 DIAGNOSIS — Z0000 Encounter for general adult medical examination without abnormal findings: Secondary | ICD-10-CM | POA: Diagnosis not present

## 2018-09-25 DIAGNOSIS — R350 Frequency of micturition: Secondary | ICD-10-CM | POA: Diagnosis not present

## 2018-09-25 DIAGNOSIS — N401 Enlarged prostate with lower urinary tract symptoms: Secondary | ICD-10-CM | POA: Diagnosis not present

## 2018-09-25 DIAGNOSIS — Z0001 Encounter for general adult medical examination with abnormal findings: Secondary | ICD-10-CM | POA: Diagnosis not present

## 2019-03-04 ENCOUNTER — Encounter: Payer: 59 | Admitting: Internal Medicine

## 2019-04-10 ENCOUNTER — Encounter: Payer: Self-pay | Admitting: Internal Medicine

## 2019-04-10 ENCOUNTER — Ambulatory Visit (INDEPENDENT_AMBULATORY_CARE_PROVIDER_SITE_OTHER): Payer: Self-pay | Admitting: Internal Medicine

## 2019-04-10 ENCOUNTER — Other Ambulatory Visit: Payer: Self-pay

## 2019-04-10 DIAGNOSIS — K7460 Unspecified cirrhosis of liver: Secondary | ICD-10-CM

## 2019-04-10 NOTE — Progress Notes (Signed)
   Subjective:    Patient ID: Manuel Berger, male    DOB: [birth year], 63 y.o.   MRN: [id number]  HPI Here for follow up of chronic hepatitis C. I connected with  Donnal Debar on 04/10/19 by phone and verified that I am speaking with the correct person using two identifiers.   I discussed the limitations of evaluation and management by telemedicine. The patient expressed understanding and agreed to proceed.  Has genotype 1b, completed 12 weeks of Epclusa last year.  F3/4 on elastography, F2 on Fibrosure.  Platelets > 150,000 last year.  No new issues.   Did not get his ultrasound during the year.    Review of Systems  Constitutional: Negative for fatigue.  Skin: Negative for rash.  Neurological: Negative for headaches.       Objective:   Physical Exam Constitutional:      General: He is not in acute distress.    Appearance: He is well-developed.  HENT:     Mouth/Throat:     Pharynx: No oropharyngeal exudate.  Eyes:     General: No scleral icterus. Cardiovascular:     Rate and Rhythm: Normal rate and regular rhythm.     Heart sounds: Normal heart sounds. No murmur.  Pulmonary:     Effort: Pulmonary effort is normal. No respiratory distress.     Breath sounds: Normal breath sounds.  Skin:    Findings: No rash.    SH: no alcohol, no tobacco       Assessment & Plan:

## 2019-04-10 NOTE — Assessment & Plan Note (Addendum)
Will order an ultrasound of the liver in July (per pt request). He will follow up with me in January and will redo a CMP, CBC and ultrasound then. Will consider a follow up elastography as well.  Time spent: 10 minutes

## 2019-05-02 ENCOUNTER — Encounter: Payer: Self-pay | Admitting: Gastroenterology

## 2019-06-03 ENCOUNTER — Other Ambulatory Visit: Payer: Self-pay

## 2019-06-03 ENCOUNTER — Ambulatory Visit
Admission: RE | Admit: 2019-06-03 | Discharge: 2019-06-03 | Disposition: A | Discharge status: Home / Self Care | Payer: BLUE CROSS/BLUE SHIELD | Source: Ambulatory Visit | Attending: Internal Medicine | Admitting: Internal Medicine

## 2019-06-03 DIAGNOSIS — K7460 Unspecified cirrhosis of liver: Secondary | ICD-10-CM

## 2019-06-17 ENCOUNTER — Telehealth: Payer: Self-pay

## 2019-06-17 NOTE — Telephone Encounter (Signed)
Patient called office today requesting results from Ultrasound done on 7/20. Informed patient that I would have to send a message to MD to review results. Advised patient once MD responses to my message would call back with results. [place]

## 2019-06-25 NOTE — Telephone Encounter (Signed)
Ultrasound looks good, no concerns.

## 2019-11-26 ENCOUNTER — Ambulatory Visit: Payer: Self-pay | Admitting: Internal Medicine

## 2020-01-08 ENCOUNTER — Ambulatory Visit: Payer: BLUE CROSS/BLUE SHIELD | Admitting: Internal Medicine

## 2020-01-14 ENCOUNTER — Telehealth: Payer: Self-pay

## 2020-01-14 NOTE — Telephone Encounter (Signed)
COVID-19 Pre-Screening Questions:01/14/20  Do you currently have a fever (>100 F), chills or unexplained body aches? NO   Are you currently experiencing new cough, shortness of breath, sore throat, runny nose?NO  .  Have you recently travelled outside the state of [place] in the last 14 days?NO  .  Have you been in contact with someone that is currently pending confirmation of Covid19 testing or has been confirmed to have the Covid19 virus?  NO   **If the patient answers NO to ALL questions -  advise the patient to please call the clinic before coming to the office should any symptoms develop.     

## 2020-01-15 ENCOUNTER — Other Ambulatory Visit: Payer: Self-pay

## 2020-01-15 ENCOUNTER — Encounter: Payer: Self-pay | Admitting: Internal Medicine

## 2020-01-15 ENCOUNTER — Ambulatory Visit (INDEPENDENT_AMBULATORY_CARE_PROVIDER_SITE_OTHER): Payer: Medicare Other | Admitting: Internal Medicine

## 2020-01-15 VITALS — BP 152/100 | HR 84 | Temp 98.7°F | Ht 66.0 in | Wt 200.0 lb

## 2020-01-15 DIAGNOSIS — K7460 Unspecified cirrhosis of liver: Secondary | ICD-10-CM | POA: Diagnosis not present

## 2020-01-15 DIAGNOSIS — K7689 Other specified diseases of liver: Secondary | ICD-10-CM

## 2020-01-15 DIAGNOSIS — B182 Chronic viral hepatitis C: Secondary | ICD-10-CM

## 2020-01-15 LAB — COMPLETE METABOLIC PANEL WITH GFR
AG Ratio: 1.1 (calc) (ref 1.0–2.5)
ALT: 78 U/L — ABNORMAL HIGH (ref 9–46)
AST: 41 U/L — ABNORMAL HIGH (ref 10–35)
Albumin: 4.1 g/dL (ref 3.6–5.1)
Alkaline phosphatase (APISO): 86 U/L (ref 35–144)
BUN: 13 mg/dL (ref 7–25)
CO2: 27 mmol/L (ref 20–32)
Calcium: 9.5 mg/dL (ref 8.6–10.3)
Chloride: 106 mmol/L (ref 98–110)
Creat: 1.06 mg/dL (ref 0.70–1.25)
GFR, Est African American: 86 mL/min/{1.73_m2} (ref >=60)
GFR, Est Non African American: 74 mL/min/{1.73_m2} (ref >=60)
Globulin: 3.7 g/dL (calc) (ref 1.9–3.7)
Glucose, Bld: 108 mg/dL — ABNORMAL HIGH (ref 65–99)
Potassium: 4.6 mmol/L (ref 3.5–5.3)
Sodium: 140 mmol/L (ref 135–146)
Total Bilirubin: 0.4 mg/dL (ref 0.2–1.2)
Total Protein: 7.8 g/dL (ref 6.1–8.1)

## 2020-01-15 LAB — CBC WITH DIFFERENTIAL/PLATELET
Absolute Monocytes: 605 cells/uL (ref 200–950)
Basophils Absolute: 82 cells/uL (ref 0–200)
Basophils Relative: 1.3 %
Eosinophils Absolute: 536 cells/uL — ABNORMAL HIGH (ref 15–500)
Eosinophils Relative: 8.5 %
HCT: 43.1 % (ref 38.5–50.0)
Hemoglobin: 14.3 g/dL (ref 13.2–17.1)
Lymphs Abs: 3018 cells/uL (ref 850–3900)
MCH: 30.2 pg (ref 27.0–33.0)
MCHC: 33.2 g/dL (ref 32.0–36.0)
MCV: 91.1 fL (ref 80.0–100.0)
MPV: 11.5 fL (ref 7.5–12.5)
Monocytes Relative: 9.6 %
Neutro Abs: 2060 cells/uL (ref 1500–7800)
Neutrophils Relative %: 32.7 %
Platelets: 223 10*3/uL (ref 140–400)
RBC: 4.73 10*6/uL (ref 4.20–5.80)
RDW: 12.7 % (ref 11.0–15.0)
Total Lymphocyte: 47.9 %
WBC: 6.3 10*3/uL (ref 3.8–10.8)

## 2020-01-15 NOTE — Progress Notes (Signed)
   Subjective:    Patient ID: Manuel Berger, male    DOB: Sep 20, 1956, 64 y.o.   MRN: [id number]  HPI Here for follow up of liver cirrhosis. No new complaints.  No new medical issues.  No fatigue or weight loss.    Review of Systems  Gastrointestinal: Negative for abdominal pain.  Skin: Negative for rash.       Objective:   Physical Exam Constitutional:      Appearance: Normal appearance.  Eyes:     General: No scleral icterus. Skin:    Findings: No rash.  Neurological:     General: No focal deficit present.     Mental Status: He is alert.  Psychiatric:        Mood and Affect: Mood normal.   SH: no alcohol        Assessment & Plan:

## 2020-01-16 ENCOUNTER — Encounter: Payer: Self-pay | Admitting: Internal Medicine

## 2020-01-16 ENCOUNTER — Other Ambulatory Visit: Payer: Self-pay | Admitting: Internal Medicine

## 2020-01-16 DIAGNOSIS — B182 Chronic viral hepatitis C: Secondary | ICD-10-CM

## 2020-01-16 DIAGNOSIS — K7460 Unspecified cirrhosis of liver: Secondary | ICD-10-CM

## 2020-01-16 NOTE — Assessment & Plan Note (Signed)
I will schedule for ultrasound now and in 6 months.  He can rtc 1 year.

## 2020-01-16 NOTE — Assessment & Plan Note (Signed)
I repeated the LFTs and there is a small elevation. I will repeat these in 1 month with an HCV RNA to be sure not a relapse.

## 2020-01-17 ENCOUNTER — Telehealth: Payer: Self-pay | Admitting: *Deleted

## 2020-01-17 NOTE — Telephone Encounter (Signed)
RN spoke with patient.  He will call insurance next week to straighten out his coverage issues, will then schedule a follow up lab here. Andree Coss, RN

## 2020-01-17 NOTE — Telephone Encounter (Signed)
-----   Message from Gardiner Barefoot, MD sent at 01/16/2020  9:48 AM EST ----- His liver test on recheck was up a bit and I want to retest them in about 1 month.  I know he is having insurance issues so this can be done once he gets that resolved.  Order placed.  thanks

## 2020-01-21 ENCOUNTER — Ambulatory Visit
Admission: RE | Admit: 2020-01-21 | Discharge: 2020-01-21 | Disposition: A | Discharge status: Home / Self Care | Payer: Medicare Other | Source: Ambulatory Visit | Attending: Internal Medicine | Admitting: Internal Medicine

## 2020-01-21 DIAGNOSIS — K7460 Unspecified cirrhosis of liver: Secondary | ICD-10-CM

## 2020-01-23 ENCOUNTER — Telehealth: Payer: Self-pay

## 2020-01-23 NOTE — Telephone Encounter (Signed)
-----   Message from Gardiner Barefoot, MD sent at 01/21/2020  1:43 PM EST ----- Please let him know his [place] looks good, no concerns. thanks

## 2020-01-23 NOTE — Telephone Encounter (Signed)
Per Dr. Luciana Axe, relayed message to patient that his [place] looked good and he has no concerns. Patient had no questions.   Shante Maysonet Loyola Mast, RN

## 2020-02-07 ENCOUNTER — Other Ambulatory Visit: Payer: Self-pay

## 2020-02-07 ENCOUNTER — Other Ambulatory Visit: Payer: Medicare Other

## 2020-02-07 DIAGNOSIS — K7460 Unspecified cirrhosis of liver: Secondary | ICD-10-CM

## 2020-02-07 DIAGNOSIS — B182 Chronic viral hepatitis C: Secondary | ICD-10-CM

## 2020-02-11 LAB — HEPATIC FUNCTION PANEL
AG Ratio: 1.1 (calc) (ref 1.0–2.5)
ALT: 47 U/L — ABNORMAL HIGH (ref 9–46)
AST: 39 U/L — ABNORMAL HIGH (ref 10–35)
Albumin: 4.1 g/dL (ref 3.6–5.1)
Alkaline phosphatase (APISO): 87 U/L (ref 35–144)
Bilirubin, Direct: 0.1 mg/dL (ref 0.0–0.2)
Globulin: 3.7 g/dL (calc) (ref 1.9–3.7)
Indirect Bilirubin: 0.4 mg/dL (calc) (ref 0.2–1.2)
Total Bilirubin: 0.5 mg/dL (ref 0.2–1.2)
Total Protein: 7.8 g/dL (ref 6.1–8.1)

## 2020-02-11 LAB — HEPATITIS C RNA QUANTITATIVE
HCV Quantitative Log: <1.18 Log IU/mL — AB
HCV RNA, PCR, QN: <15 IU/mL — AB

## 2020-07-29 ENCOUNTER — Ambulatory Visit
Admission: RE | Admit: 2020-07-29 | Discharge: 2020-07-29 | Disposition: A | Discharge status: Home / Self Care | Payer: Medicare Other | Source: Ambulatory Visit | Attending: Internal Medicine | Admitting: Internal Medicine

## 2020-07-29 DIAGNOSIS — K7460 Unspecified cirrhosis of liver: Secondary | ICD-10-CM

## 2020-07-30 ENCOUNTER — Telehealth: Payer: Self-pay

## 2020-07-30 NOTE — Telephone Encounter (Signed)
Patient made of results. Patient was very appreciative of call.  Manuel Berger

## 2020-07-30 NOTE — Telephone Encounter (Signed)
-----   Message from Gardiner Barefoot, MD sent at 07/30/2020  2:24 PM EDT ----- Please let him know his ultrasound looks good, no concners.  thanks

## 2021-07-21 ENCOUNTER — Other Ambulatory Visit (HOSPITAL_COMMUNITY): Payer: Self-pay

## 2021-07-23 ENCOUNTER — Ambulatory Visit: Payer: Medicare Other | Admitting: Internal Medicine

## 2021-08-23 ENCOUNTER — Ambulatory Visit (INDEPENDENT_AMBULATORY_CARE_PROVIDER_SITE_OTHER): Payer: Medicare Other | Admitting: Internal Medicine

## 2021-08-23 ENCOUNTER — Encounter: Payer: Self-pay | Admitting: Internal Medicine

## 2021-08-23 ENCOUNTER — Other Ambulatory Visit: Payer: Self-pay

## 2021-08-23 VITALS — BP 149/90 | HR 72 | Temp 98.1°F | Ht 66.0 in | Wt 190.0 lb

## 2021-08-23 DIAGNOSIS — R7401 Elevation of levels of liver transaminase levels: Secondary | ICD-10-CM | POA: Diagnosis not present

## 2021-08-23 DIAGNOSIS — K7460 Unspecified cirrhosis of liver: Secondary | ICD-10-CM

## 2021-08-23 DIAGNOSIS — B182 Chronic viral hepatitis C: Secondary | ICD-10-CM | POA: Diagnosis not present

## 2021-08-23 NOTE — Assessment & Plan Note (Signed)
elastography with F3-4, will continue with HCC screening twice yearly.

## 2021-08-23 NOTE — Assessment & Plan Note (Signed)
Mildly elevated AST to 41.  Will continue to monitor yearly.  Will recheck HCV RNA as above.

## 2021-08-23 NOTE — Progress Notes (Signed)
   Subjective:    Patient ID: Manuel Berger, male    DOB: 14-Apr-1956, 65 y.o.   MRN: [id number]  HPI Here for follow up of chronic hepatitis C I last saw him in March 2021 and had completed treatement for hepatitis C, genotype 1b, viral load 4 million.  He also is followed by liver fibrosis with an elastography of F3-4, though no cirrhosis on ultrasound.  His last ultrasound was 1 year ago.     Review of Systems  Constitutional:  Negative for fatigue.  Gastrointestinal:  Negative for diarrhea.      Objective:   Physical Exam Eyes:     General: No scleral icterus. Pulmonary:     Effort: Pulmonary effort is normal.  Neurological:     Mental Status: He is alert.  Psychiatric:        Mood and Affect: Mood normal.   SH: no alcohol       Assessment & Plan:

## 2021-08-23 NOTE — Assessment & Plan Note (Signed)
I will recheck the HCV RNA since it last was < 15 but detectable.

## 2021-08-26 LAB — HEPATITIS C RNA QUANTITATIVE
HCV Quantitative Log: <1.18 log IU/mL
HCV RNA, PCR, QN: <15 IU/mL

## 2021-08-30 ENCOUNTER — Ambulatory Visit
Admission: RE | Admit: 2021-08-30 | Discharge: 2021-08-30 | Disposition: A | Discharge status: Home / Self Care | Payer: Medicare Other | Source: Ambulatory Visit | Attending: Internal Medicine | Admitting: Internal Medicine

## 2021-08-30 ENCOUNTER — Other Ambulatory Visit: Payer: Self-pay

## 2021-08-30 DIAGNOSIS — K7460 Unspecified cirrhosis of liver: Secondary | ICD-10-CM

## 2021-08-30 DIAGNOSIS — B182 Chronic viral hepatitis C: Secondary | ICD-10-CM

## 2021-09-05 IMAGING — US US ABDOMEN LIMITED
1 series · 14 of 25 positions shown · non-contrast
Comparison: Abdominal ultrasound 06/03/2019 and 05/03/2018.
Abdominal MRI 09/14/2017.

CLINICAL DATA: Cirrhosis.  Hepatitis C.

EXAM:
ULTRASOUND ABDOMEN LIMITED RIGHT UPPER QUADRANT

[Series 1: us abdomen limited · 0.21mm/px · 14 of 40 slices shown]
[im 1/40]
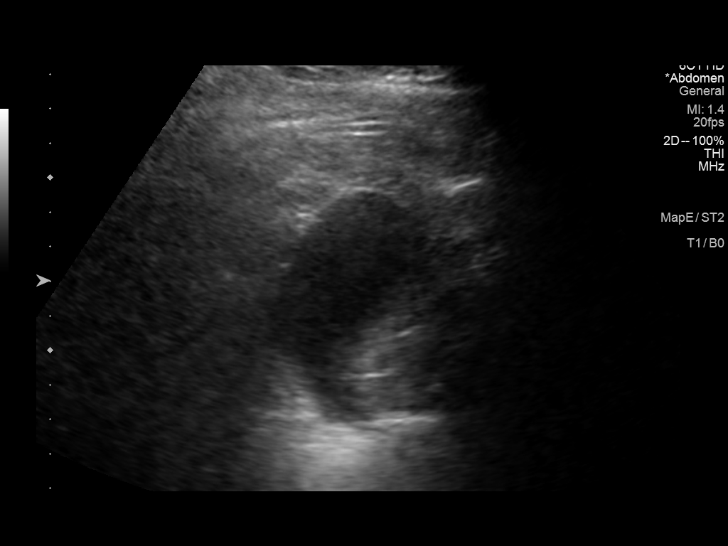
[im 4/40]
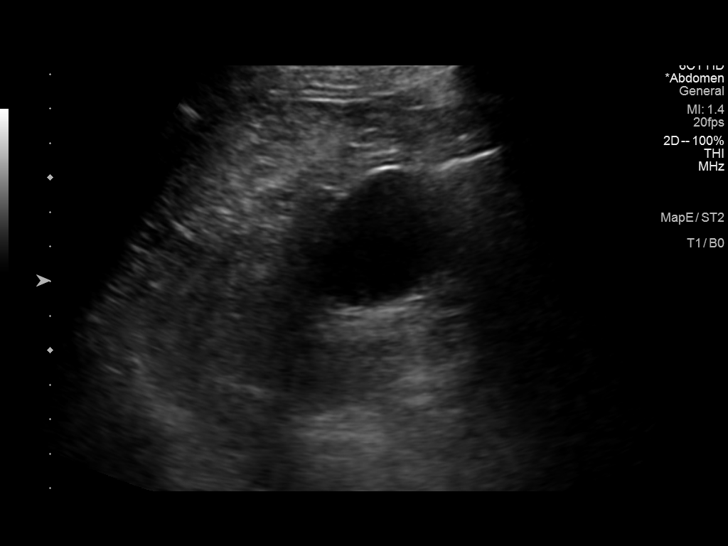
[im 7/40]
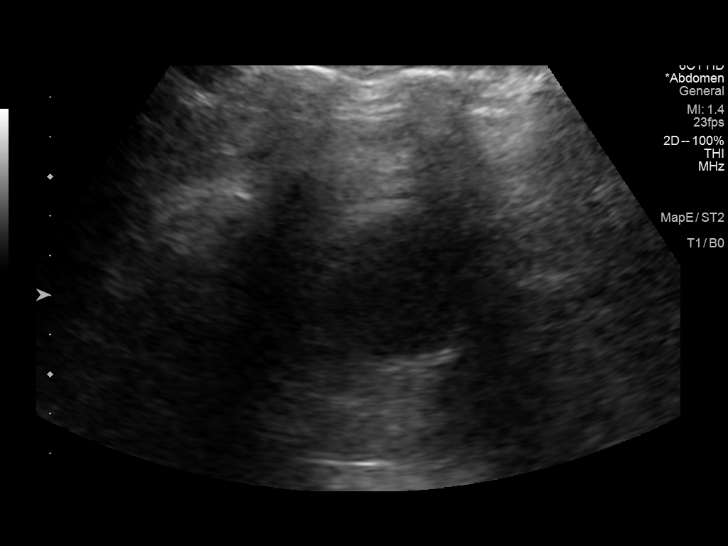
[im 10/40]
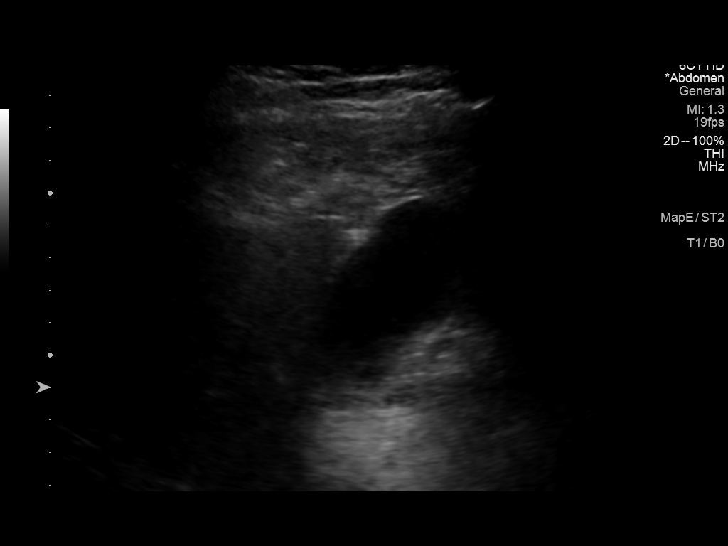
[im 14/40]
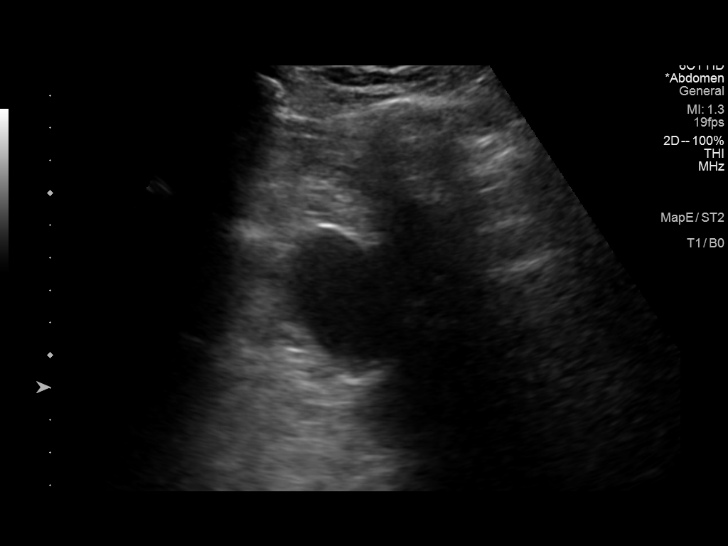
[im 15/40]
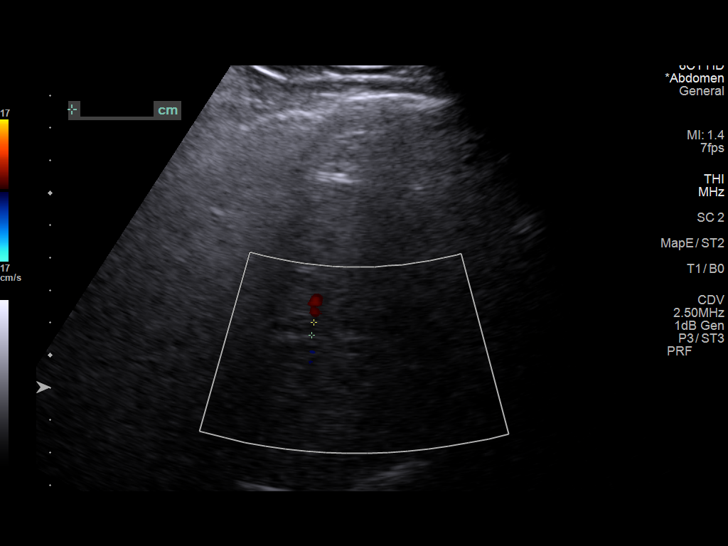
[im 18/40]
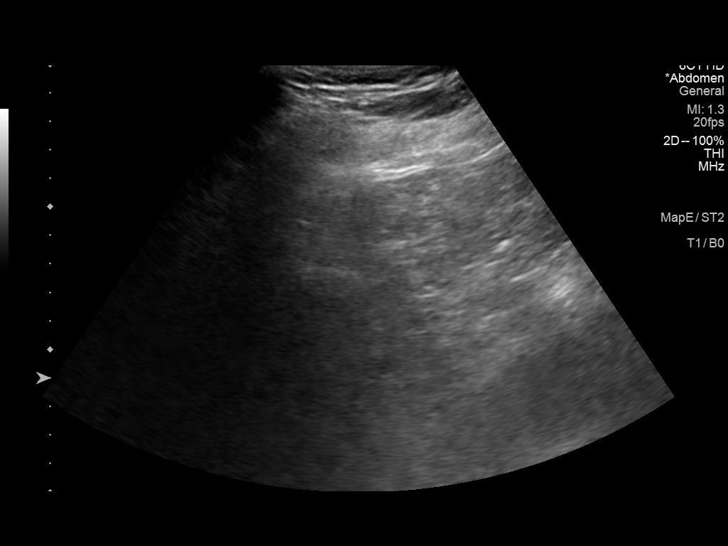
[im 22/40]
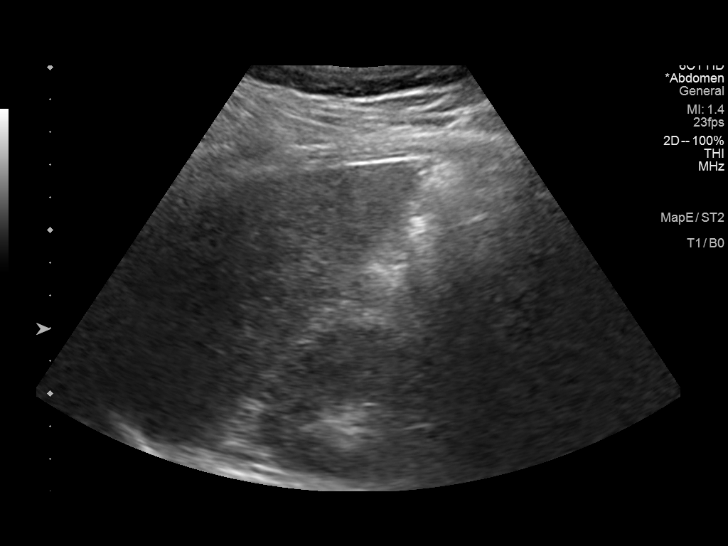
[im 25/40]
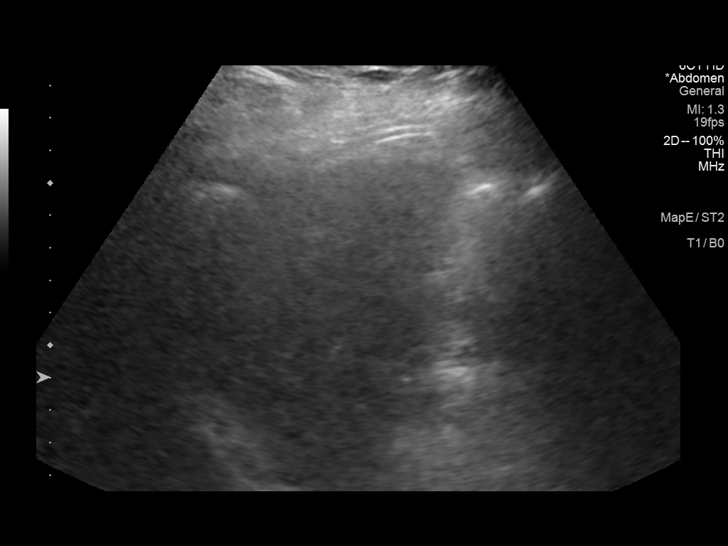
[im 27/40]
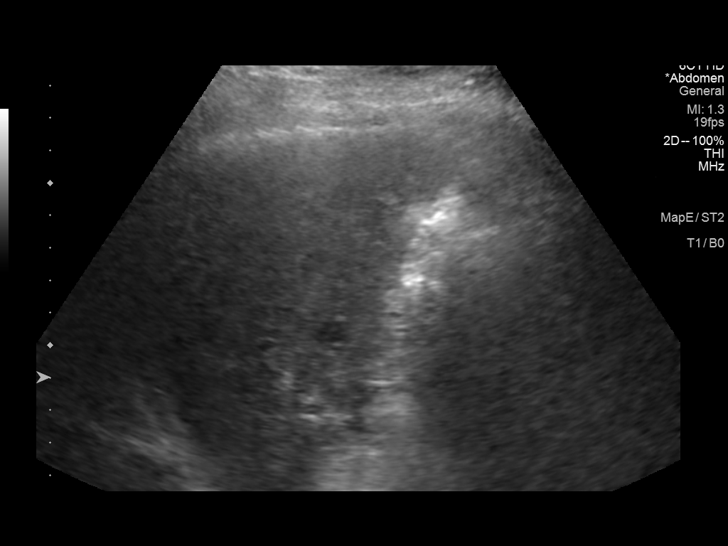
[im 30/40]
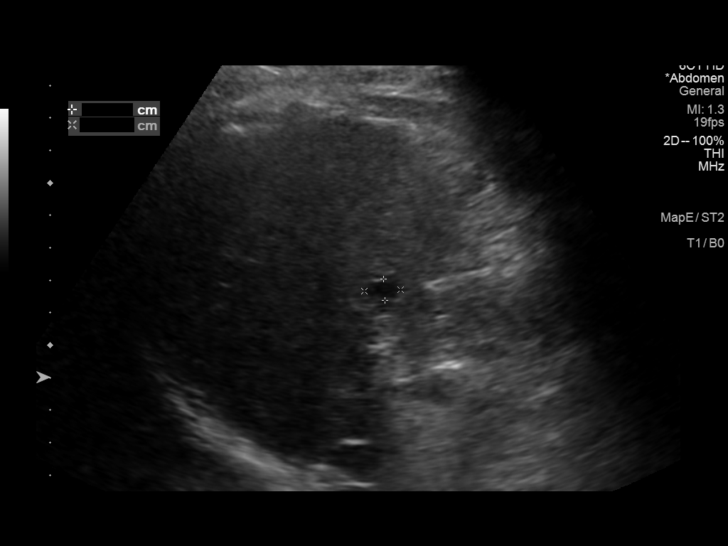
[im 33/40]
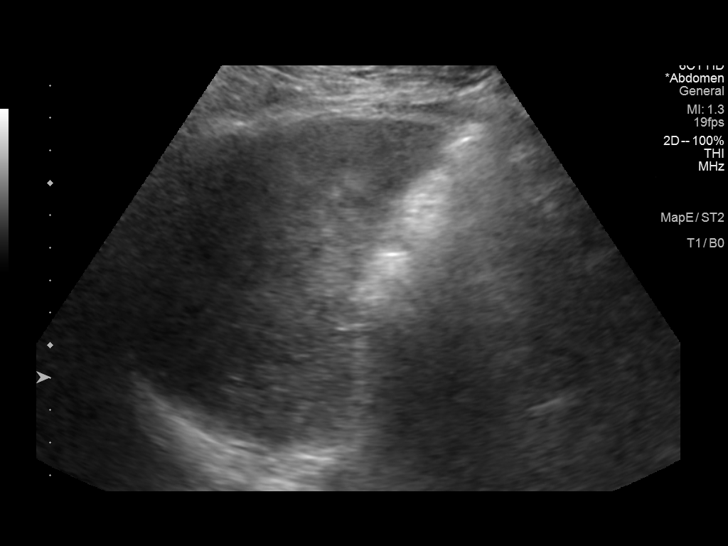
[im 36/40]
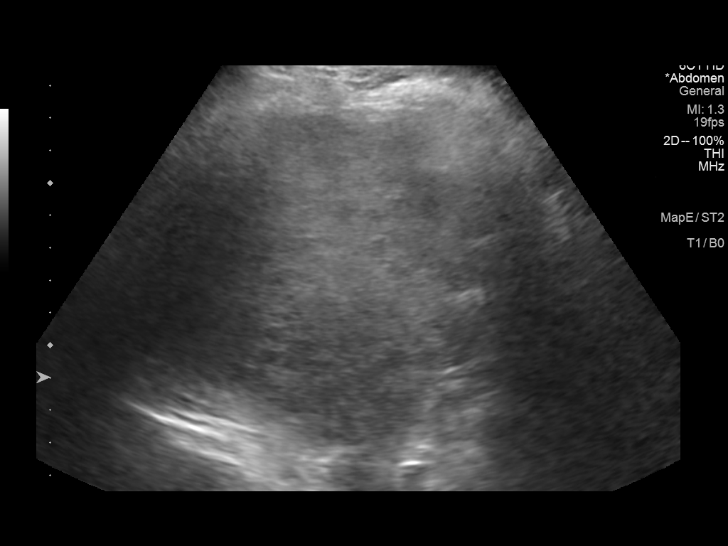
[im 40/40]
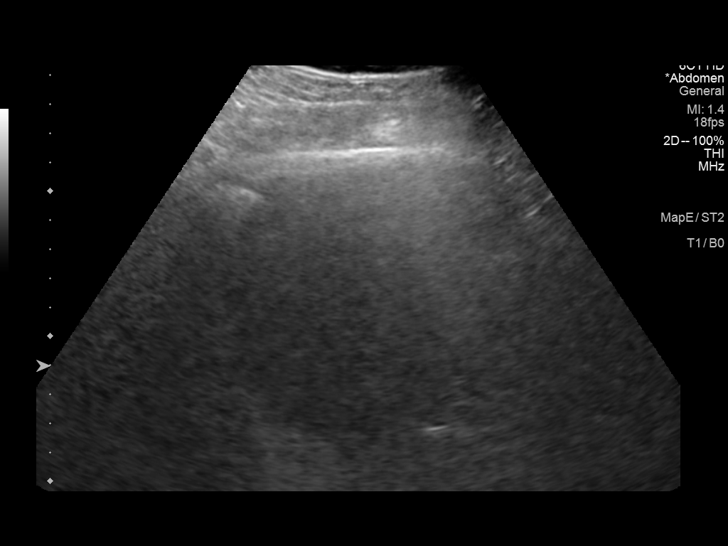

[14 of 25 positions shown; findings below may reference images not displayed]

FINDINGS: Gallbladder:

No gallstones or wall thickening visualized. No sonographic Murphy
sign noted by sonographer.

Common bile duct:

Diameter: 4 mm

Liver:

The hepatic echogenicity is heterogeneous, and there is incomplete
penetration of the liver. There is a probable small cyst in the
right lobe, measuring 11 mm maximally which is grossly stable. No
other focal hepatic lesions identified. Portal vein is patent on
color Doppler imaging with normal direction of blood flow towards
the liver.

Other: No ascites.  Study is somewhat limited by body habitus.
IMPRESSION: 1. Stable probable small cyst in the right hepatic lobe. No new or
enlarging hepatic lesions identified.
2. Underlying heterogeneity of the hepatic parenchyma, similar to
previous studies and consistent with cirrhosis. No biliary
dilatation.

## 2021-09-09 ENCOUNTER — Ambulatory Visit: Payer: Medicare Other | Admitting: Physical Therapy

## 2021-09-15 ENCOUNTER — Ambulatory Visit: Payer: Medicare Other | Admitting: Physical Therapy

## 2021-09-22 ENCOUNTER — Ambulatory Visit: Payer: Medicare Other | Attending: Physician Assistant | Admitting: Physical Therapy

## 2021-09-22 ENCOUNTER — Ambulatory Visit: Payer: Medicare Other | Admitting: Physical Therapy

## 2021-09-22 ENCOUNTER — Other Ambulatory Visit: Payer: Self-pay

## 2021-09-22 ENCOUNTER — Encounter: Payer: Self-pay | Admitting: Physical Therapy

## 2021-09-22 DIAGNOSIS — R600 Localized edema: Secondary | ICD-10-CM | POA: Diagnosis present

## 2021-09-22 DIAGNOSIS — M25512 Pain in left shoulder: Secondary | ICD-10-CM | POA: Insufficient documentation

## 2021-09-22 DIAGNOSIS — M25561 Pain in right knee: Secondary | ICD-10-CM | POA: Insufficient documentation

## 2021-09-22 DIAGNOSIS — G8929 Other chronic pain: Secondary | ICD-10-CM | POA: Insufficient documentation

## 2021-09-22 DIAGNOSIS — M25562 Pain in left knee: Secondary | ICD-10-CM | POA: Diagnosis present

## 2021-09-22 DIAGNOSIS — M6281 Muscle weakness (generalized): Secondary | ICD-10-CM | POA: Diagnosis present

## 2021-09-22 NOTE — Therapy (Signed)
[facility] [address] [place], [place], [address] Phone: [phone]   Fax:  [phone]  Physical Therapy Evaluation  Patient Details  Name: Manuel Berger MRN: [id number] Date of Birth: September 01, 1956 Referring Provider (PT): Virgilio Belling, [place]   Encounter Date: 09/22/2021   PT End of Session - 09/22/21 1654     Visit Number 1    Number of Visits 17    Date for PT Re-Evaluation 11/17/21    PT Start Time 1547    PT Stop Time 1638    PT Time Calculation (min) 51 min    Activity Tolerance Patient tolerated treatment well    Behavior During Therapy [facility] for tasks assessed/performed             Past Medical History:  Diagnosis Date   ALLERGIC RHINITIS 12/05/2007   BURSITIS, LEFT HIP 04/30/2008   GLUCOSE INTOLERANCE 12/05/2007   HEPATITIS C 12/05/2007   HYPERLIPIDEMIA 12/05/2007   Impaired glucose tolerance 03/23/2011   KNEE PAIN, BILATERAL 03/09/2010   OSTEOARTHRITIS, KNEES, BILATERAL 04/30/2008    History reviewed. No pertinent surgical history.  There were no vitals filed for this visit.    Subjective Assessment - 09/22/21 1548     Subjective Patient reports B knee pain and L shoulder pain., both chronic. He thinks he may have torn his rotator cuff after a fall 5 years. B knee pain chronic, since he was in [employer]. He feels like they are getting worse. he got Cortisone injections.    Pertinent History OA    Limitations Lifting;Walking;Standing    How long can you sit comfortably? N/A    How long can you stand comfortably? 15-20 minutes.    How long can you walk comfortably? He has limited walking due to fear of pain.    Diagnostic tests X rays taken a few years ago showed OA.    Patient Stated Goals Be able to walk his dog, lift things with his LUE.    Currently in Pain? Yes    Pain Score 4     Pain Location Shoulder    Pain Orientation Left;Anterior;Proximal    Pain Descriptors / Indicators Throbbing     Pain Type Chronic pain    Pain Radiating Towards B feet    Pain Onset More than a month ago    Pain Frequency Constant    Aggravating Factors  Standing walking, reaching , squatting or stooping.overhead    Pain Relieving Factors Heated rubs    Effect of Pain on Daily Activities patient reports he has self limited all activities due to pain and fear of falling or dropping things.                [facility] PT Assessment - 09/22/21 0001       Assessment   Referring Provider (PT) Virgilio Belling, PA      Balance Screen   Has the patient fallen in the past 6 months No    Has the patient had a decrease in activity level because of a fear of falling?  No    Is the patient reluctant to leave their home because of a fear of falling?  No      Home Nurse, mental health Private residence    Living Arrangements Spouse/significant other    Available Help at Discharge Family    Type of Home House    Home Access Level entry    Home Layout Two  level    Additional Comments Patient resides on first floor of his house.      Prior Function   Level of Independence Independent    Vocation Full time employment    Writer- loads and delivers equipment    Leisure Walks his dog      Cognition   Overall Cognitive Status Within Functional Limits for tasks assessed      Posture/Postural Control   Posture Comments slightly forward head, rounded shoulders, slightly increased lumbar lordosis.      ROM / Strength   AROM / PROM / Strength AROM;Strength      AROM   Overall AROM  Deficits    Overall AROM Comments elbow and wrist ROM WNL B, BLE WFL, although tight in B hips.    AROM Assessment Site Shoulder;Hip;Knee;Ankle    Right/Left Shoulder Right;Left    Right Shoulder Flexion 150 Degrees    Right Shoulder External Rotation 62 Degrees    Left Shoulder Flexion 106 Degrees    Left Shoulder ABduction 90 Degrees    Left Shoulder External Rotation 27  Degrees      Strength   Overall Strength Deficits    Overall Strength Comments B elbow and wrist WNL    Strength Assessment Site Shoulder;Hip;Knee;Ankle    Right/Left Shoulder Right;Left    Right Shoulder Flexion 4-/5    Right Shoulder ABduction 4/5    Left Shoulder Flexion 3+/5    Left Shoulder ABduction 3+/5    Right/Left Hip Right;Left    Right Hip Flexion 4-/5    Right Hip Extension 3-/5    Right Hip ABduction 4-/5    Left Hip Flexion 4-/5    Left Hip Extension 3-/5    Left Hip ABduction 4-/5    Right/Left Knee Right;Left    Right Knee Flexion 3+/5    Right Knee Extension 3+/5    Left Knee Flexion 3+/5    Left Knee Extension 3+/5    Right/Left Ankle Right;Left    Right Ankle Dorsiflexion 3+/5    Right Ankle Plantar Flexion 3/5    Right Ankle Inversion 4-/5    Right Ankle Eversion 4-/5    Left Ankle Dorsiflexion 3+/5    Left Ankle Plantar Flexion 3/5    Left Ankle Inversion 4-/5    Left Ankle Eversion 4-/5      Palpation   Patella mobility Mildly limited B due to pain.    Palpation comment TTP on L ant shoulder, supraspinatus muscle belly and tendon, B ITB tight and TTP, B knees TTP along medial/anterior/distal knee joint.      Ambulation/Gait   Ambulation/Gait Yes    Ambulation/Gait Assistance 7: Independent    Ambulation Distance (Feet) 500 Feet    Assistive device None    Gait Pattern Step-through pattern;Lateral hip instability;Wide base of support   increased lateral sway   Ambulation Surface Level                        Objective measurements completed on examination: See above findings.                PT Education - 09/22/21 1653     Education Details Unable to initiate HEP today due to multiple evaluation sites. Educated him to POC and plan to initiate HEP on next visit.    Person(s) Educated Patient    Methods Explanation    Comprehension Verbalized understanding  PT Short Term Goals - 09/22/21 1706        PT SHORT TERM GOAL #1   Title I with basic HEP    Time 4    Period Weeks    Status New    Target Date 10/20/21               PT Long Term Goals - 09/22/21 1707       PT LONG TERM GOAL #1   Title I with final HEP    Time 8    Period Weeks    Status New    Target Date 11/17/21      PT LONG TERM GOAL #2   Title Tug < 12 seconds    Baseline TBD    Time 8    Period Weeks    Status New    Target Date 11/17/21      PT LONG TERM GOAL #3   Title 5 x STS in < 10 seconds    Baseline TBD    Time 8    Period Weeks    Status New    Target Date 11/17/21      PT LONG TERM GOAL #4   Title Patient will increase L shoulder strength to 4-/5 in all planes.    Baseline Strength 3-/5,    Time 8    Period Weeks    Status New    Target Date 11/17/21      PT LONG TERM GOAL #5   Title Improve L shoulder AROM sufficiently to reach behind head for self care activities.    Baseline elevates to 106 flexion, 90 abd    Time 8    Period Weeks    Status New    Target Date 11/17/21                    Plan - 09/22/21 1615     Clinical Impression Statement Patient is a pleasant gentleman with multiple chronic areas of pain in L shoulder and B knees. Diagnosed with OA, has received Cortisone injection which was temporarily effective at controlling knee pain, but it has returned and B knees at times buckle and swell. L shoulder pain begam with a fall about 5 years ago. he cannot lift arm above 90 degrees, is TTP in supraspinatus muscle belly and tendon, no Biceps tendon pain. 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 He would benefit from PT to improve his strength throughout his core and extremities- he appears weak in B hips and core. Also need to further assess his feet in WB and whether he needs increased support, and further assessment of shoulder pain.    Personal Factors and Comorbidities Past/Current Experience;Comorbidity  1;Profession;Age    Comorbidities OA    Examination-Activity Limitations Reach Overhead;Locomotion Level;Bend;Sleep;Squat;Dressing;Hygiene/Grooming;Stand;Stairs;Lift    Examination-Participation Restrictions Occupation;Yard Work    Stability/Clinical Decision Making Stable/Uncomplicated    Clinical Decision Making Moderate    Rehab Potential Good    PT Frequency 2x / week    PT Duration 8 weeks    PT Treatment/Interventions ADLs/Self Care Home Management;Iontophoresis 4mg /ml Dexamethasone;Gait training;Neuromuscular re-education;Manual techniques;Joint Manipulations;Stair training;Moist Heat;Traction;Cryotherapy;Ultrasound; ;Therapeutic exercise;Therapeutic activities;Functional mobility training;Patient/family education;Orthotic Fit/Training    PT Next Visit Plan Assess B feet in WB and ROM, establish HEP, emphasize core, hip strength, scap stability. TUG, 5 x sit to stand    Consulted and Agree with Plan of Care Patient  Patient will benefit from skilled therapeutic intervention in order to improve the following deficits and impairments:  Decreased range of motion, Difficulty walking, Increased fascial restricitons, Increased muscle spasms, Decreased activity tolerance, Pain, Decreased balance, Decreased mobility, Decreased strength, Postural dysfunction, Impaired flexibility  Visit Diagnosis: Localized edema  Muscle weakness (generalized)  Chronic pain of left knee  Chronic pain of right knee  Chronic left shoulder pain     Problem List Patient Active Problem List   Diagnosis Date Noted   Transaminitis 08/23/2021   Cirrhosis of liver without ascites (HCC) 04/12/2018   Hepatic cyst 09/07/2017   Great toe pain 06/07/2017   Abnormal TSH 02/02/2017   Bilateral ganglion cysts of wrists 01/25/2016   Bilateral knee pain 10/30/2014   Lower back pain 07/25/2013   Right ankle sprain 03/24/2011   Hyperglycemia 03/23/2011    Preventative health care 03/23/2011   OSTEOARTHRITIS, KNEES, BILATERAL 04/30/2008   BURSITIS, LEFT HIP 04/30/2008   Chronic hepatitis C without hepatic coma (HCC) 12/05/2007   Hyperlipidemia 12/05/2007   Allergic rhinitis 12/05/2007    Iona Beard, DPT 09/22/2021, 5:16 PM  [facility] [address] [place], [place], [address] Phone: [phone]   Fax:  [phone]  Name: Manuel Berger MRN: [id number] Date of Birth: 1956-03-02

## 2021-09-29 ENCOUNTER — Ambulatory Visit: Payer: Medicare Other | Admitting: Physical Therapy

## 2021-09-29 ENCOUNTER — Encounter: Payer: Self-pay | Admitting: Physical Therapy

## 2021-09-29 ENCOUNTER — Other Ambulatory Visit: Payer: Self-pay

## 2021-09-29 DIAGNOSIS — M6281 Muscle weakness (generalized): Secondary | ICD-10-CM

## 2021-09-29 DIAGNOSIS — M25512 Pain in left shoulder: Secondary | ICD-10-CM

## 2021-09-29 DIAGNOSIS — R600 Localized edema: Secondary | ICD-10-CM | POA: Diagnosis not present

## 2021-09-29 DIAGNOSIS — G8929 Other chronic pain: Secondary | ICD-10-CM

## 2021-09-29 DIAGNOSIS — M25562 Pain in left knee: Secondary | ICD-10-CM

## 2021-09-29 NOTE — Therapy (Signed)
[facility] [address] [place], [place], [address] Phone: [phone]   Fax:  [phone]  Physical Therapy Treatment  Patient Details  Name: Manuel Berger MRN: [id number] Date of Birth: 18-Aug-1956 Referring Provider (PT): Virgilio Belling, [place]   Encounter Date: 09/29/2021   PT End of Session - 09/29/21 1806     Visit Number 2    Number of Visits 17    Date for PT Re-Evaluation 11/17/21    PT Start Time 1715    PT Stop Time 1758    PT Time Calculation (min) 43 min    Activity Tolerance Patient tolerated treatment well    Behavior During Therapy [facility] for tasks assessed/performed             Past Medical History:  Diagnosis Date   ALLERGIC RHINITIS 12/05/2007   BURSITIS, LEFT HIP 04/30/2008   GLUCOSE INTOLERANCE 12/05/2007   HEPATITIS C 12/05/2007   HYPERLIPIDEMIA 12/05/2007   Impaired glucose tolerance 03/23/2011   KNEE PAIN, BILATERAL 03/09/2010   OSTEOARTHRITIS, KNEES, BILATERAL 04/30/2008    History reviewed. No pertinent surgical history.  There were no vitals filed for this visit.   Subjective Assessment - 09/29/21 1718     Subjective Patient reports that his R medial knee pain has increased since yesterday.    Pertinent History OA    Limitations Lifting;Walking;Standing    How long can you sit comfortably? N/A    How long can you stand comfortably? 15-20 minutes.    How long can you walk comfortably? He has limited walking due to fear of pain.    Diagnostic tests X rays taken a few years ago showed OA.    Patient Stated Goals Be able to walk his dog, lift things with his LUE.    Currently in Pain? Yes    Pain Score 7     Pain Location Knee    Pain Orientation Medial;Right    Pain Descriptors / Indicators Aching;Throbbing    Pain Type Chronic pain    Pain Onset More than a month ago    Pain Frequency Constant                [facility] PT Assessment - 09/29/21 0001       Standardized Balance  Assessment   Standardized Balance Assessment Five Times Sit to Stand;Timed Up and Go Test    Five times sit to stand comments  18 sec      Timed Up and Go Test   Normal TUG (seconds) 7.7                           [facility] Adult PT Treatment/Exercise - 09/29/21 0001       Exercises   Exercises Knee/Hip;Shoulder      Knee/Hip Exercises: Stretches   Passive Hamstring Stretch Both;20 seconds;2 reps    Passive Hamstring Stretch Limitations Green strap, straight, then into abd and then into add.      Knee/Hip Exercises: Aerobic   Nustep L5 x 5 minutes.      Knee/Hip Exercises: Supine   Bridges Strengthening;Both;1 set;10 reps    Bridges with Newman Pies Squeeze Both;1 set;10 reps    Henreitta Leber with Clamshell Both;Strengthening;1 set;10 reps      Manual Therapy   Manual Therapy Joint mobilization;Soft tissue mobilization;Passive ROM    Joint Mobilization B feet and ankles    Soft tissue mobilization B feet and ankles  Passive ROM B feet and ankles                     PT Education - 09/29/21 1753     Education Details HEP    Person(s) Educated Patient    Methods Explanation;Demonstration;Handout    Comprehension Returned demonstration;Verbalized understanding              PT Short Term Goals - 09/29/21 1918       PT SHORT TERM GOAL #1   Title I with basic HEP    Baseline Initiated    Time 3    Period Weeks    Status On-going    Target Date 10/20/21               PT Long Term Goals - 09/22/21 1707       PT LONG TERM GOAL #1   Title I with final HEP    Time 8    Period Weeks    Status New    Target Date 11/17/21      PT LONG TERM GOAL #2   Title Tug < 12 seconds    Baseline TBD    Time 8    Period Weeks    Status New    Target Date 11/17/21      PT LONG TERM GOAL #3   Title 5 x STS in < 10 seconds    Baseline TBD    Time 8    Period Weeks    Status New    Target Date 11/17/21      PT LONG TERM GOAL #4   Title Patient  will increase L shoulder strength to 4-/5 in all planes.    Baseline Strength 3-/5,    Time 8    Period Weeks    Status New    Target Date 11/17/21      PT LONG TERM GOAL #5   Title Improve L shoulder AROM sufficiently to reach behind head for self care activities.    Baseline elevates to 106 flexion, 90 abd    Time 8    Period Weeks    Status New    Target Date 11/17/21                   Plan - 09/29/21 1913     Clinical Impression Statement Patient reports R knee pain today, worse yesterday due to weather, but continues today. Therapist assessed feet and ankles, which are very stiff and painful. Performed some mobilizations and soft tissue mobilization followed by stretch. Initiated trunk strengthening and stretching for lower body, which patien tfelt helped decreae his knee pain immediately. Initiated HEP with same activities.    Personal Factors and Comorbidities Past/Current Experience;Comorbidity 1;Profession;Age    Comorbidities OA    Examination-Activity Limitations Reach Overhead;Locomotion Level;Bend;Sleep;Squat;Dressing;Hygiene/Grooming;Stand;Stairs;Lift    Examination-Participation Restrictions Occupation;Yard Work    Stability/Clinical Decision Making Stable/Uncomplicated    Clinical Decision Making Moderate    Rehab Potential Good    PT Frequency 2x / week    PT Duration 8 weeks    PT Treatment/Interventions ADLs/Self Care Home Management;Iontophoresis 4mg /ml Dexamethasone;Gait training;Neuromuscular re-education;Manual techniques;Joint Manipulations;Stair training;Moist Heat;Traction;Cryotherapy;Ultrasound; ;Therapeutic exercise;Therapeutic activities;Functional mobility training;Patient/family education;Orthotic Fit/Training    PT Next Visit Plan Update HEp with scapular stabilization activities, piriformis and ITB stretches.    PT Home Exercise Plan [facility]    Consulted and Agree with Plan of Care Patient  Patient will benefit from skilled therapeutic intervention in order to improve the following deficits and impairments:  Decreased range of motion, Difficulty walking, Increased fascial restricitons, Increased muscle spasms, Decreased activity tolerance, Pain, Decreased balance, Decreased mobility, Decreased strength, Postural dysfunction, Impaired flexibility  Visit Diagnosis: Localized edema  Muscle weakness (generalized)  Chronic pain of right knee  Chronic left shoulder pain  Chronic pain of left knee     Problem List Patient Active Problem List   Diagnosis Date Noted   Transaminitis 08/23/2021   Cirrhosis of liver without ascites (HCC) 04/12/2018   Hepatic cyst 09/07/2017   Great toe pain 06/07/2017   Abnormal TSH 02/02/2017   Bilateral ganglion cysts of wrists 01/25/2016   Bilateral knee pain 10/30/2014   Lower back pain 07/25/2013   Right ankle sprain 03/24/2011   Hyperglycemia 03/23/2011   Preventative health care 03/23/2011   OSTEOARTHRITIS, KNEES, BILATERAL 04/30/2008   BURSITIS, LEFT HIP 04/30/2008   Chronic hepatitis C without hepatic coma (HCC) 12/05/2007   Hyperlipidemia 12/05/2007   Allergic rhinitis 12/05/2007    Iona Beard, PT 09/29/2021, 7:23 PM  [facility] [place] [address] [place], [place], [address] Phone: [phone]   Fax:  [phone]  Name: Manuel Berger MRN: [id number] Date of Birth: 26-Apr-1956

## 2021-09-29 NOTE — Patient Instructions (Signed)
Access Code: [facility] URL: [email or web address] Date: 09/29/2021 Prepared by: Oley Balm  Exercises Foot Roller Plantar Massage - 1 x daily - 7 x weekly - 12 reps Seated Plantar Fascia Stretch - 1 x daily - 7 x weekly - 1 sets - 10 reps - 10 hold Supine Ankle Inversion AROM - 1 x daily - 7 x weekly - 1 sets - 10 reps - 10 hold Supine Ankle Eversion AROM - 1 x daily - 7 x weekly - 1 sets - 10 reps - 10 hold Supine Hamstring Stretch with Strap - 1 x daily - 7 x weekly - 1 sets - 2 reps - 20 hold Supine Bridge with Resistance Band - 1 x daily - 7 x weekly - 2 sets - 10 reps Supine Bridge with Mini Swiss Ball Between Knees - 1 x daily - 7 x weekly - 2 sets - 10 reps

## 2021-10-05 ENCOUNTER — Encounter: Payer: Medicare Other | Admitting: Physical Therapy

## 2021-10-06 ENCOUNTER — Encounter: Payer: Medicare Other | Admitting: Physical Therapy

## 2021-10-11 ENCOUNTER — Encounter: Payer: Medicare Other | Admitting: Physical Therapy

## 2021-10-13 ENCOUNTER — Ambulatory Visit: Payer: Medicare Other | Admitting: Physical Therapy

## 2021-10-13 ENCOUNTER — Other Ambulatory Visit: Payer: Self-pay

## 2021-10-13 ENCOUNTER — Encounter: Payer: Self-pay | Admitting: Physical Therapy

## 2021-10-13 DIAGNOSIS — R600 Localized edema: Secondary | ICD-10-CM | POA: Diagnosis not present

## 2021-10-13 DIAGNOSIS — M6281 Muscle weakness (generalized): Secondary | ICD-10-CM

## 2021-10-13 DIAGNOSIS — G8929 Other chronic pain: Secondary | ICD-10-CM

## 2021-10-13 DIAGNOSIS — M25561 Pain in right knee: Secondary | ICD-10-CM

## 2021-10-13 NOTE — Therapy (Signed)
[facility] [address] [place], [place], [address] Phone: [phone]   Fax:  [phone]  Physical Therapy Treatment  Patient Details  Name: Manuel Berger MRN: [id number] Date of Birth: 1956-04-05 Referring Provider (PT): Virgilio Belling, [place]   Encounter Date: 10/13/2021   PT End of Session - 10/13/21 1753     Visit Number 3    Number of Visits 17    Date for PT Re-Evaluation 11/17/21    PT Start Time 1702    PT Stop Time 1741    PT Time Calculation (min) 39 min    Activity Tolerance Patient tolerated treatment well    Behavior During Therapy [facility] for tasks assessed/performed             Past Medical History:  Diagnosis Date   ALLERGIC RHINITIS 12/05/2007   BURSITIS, LEFT HIP 04/30/2008   GLUCOSE INTOLERANCE 12/05/2007   HEPATITIS C 12/05/2007   HYPERLIPIDEMIA 12/05/2007   Impaired glucose tolerance 03/23/2011   KNEE PAIN, BILATERAL 03/09/2010   OSTEOARTHRITIS, KNEES, BILATERAL 04/30/2008    History reviewed. No pertinent surgical history.  There were no vitals filed for this visit.   Subjective Assessment - 10/13/21 1702     Subjective My knees and shoulders are still bothering me, I may have left shoulder surgery but I want to push it to next summer; doing the exercises they've shown me before has really been helping    Pertinent History OA    Patient Stated Goals Be able to walk his dog, lift things with his LUE.    Currently in Pain? Yes    Pain Score --   L shoulder 8/10, B knees 6/10   Pain Location --   L shoulder and B knees   Pain Orientation Left;Right    Pain Descriptors / Indicators Nagging    Pain Type Chronic pain                               [facility] Adult PT Treatment/Exercise - 10/13/21 0001       Knee/Hip Exercises: Stretches   Piriformis Stretch Both;2 reps;30 seconds    Piriformis Stretch Limitations figure 4    Other Knee/Hip Stretches gastroc stretches 3x30  seconds      Shoulder Exercises: Seated   Other Seated Exercises scap retractions, backwards shoulder rolls 1x15      Manual Therapy   Manual Therapy Soft tissue mobilization;Joint mobilization    Joint Mobilization B feet and ankles    Soft tissue mobilization STM/trigger point release L pec origin and L UT                     PT Education - 10/13/21 1752     Education Details HEP updates, POC moving forward, benfeits of DN    Person(s) Educated Patient    Methods Explanation;Handout    Comprehension Verbalized understanding              PT Short Term Goals - 09/29/21 1918       PT SHORT TERM GOAL #1   Title I with basic HEP    Baseline Initiated    Time 3    Period Weeks    Status On-going    Target Date 10/20/21               PT Long Term Goals - 09/22/21 1707  PT LONG TERM GOAL #1   Title I with final HEP    Time 8    Period Weeks    Status New    Target Date 11/17/21      PT LONG TERM GOAL #2   Title Tug < 12 seconds    Baseline TBD    Time 8    Period Weeks    Status New    Target Date 11/17/21      PT LONG TERM GOAL #3   Title 5 x STS in < 10 seconds    Baseline TBD    Time 8    Period Weeks    Status New    Target Date 11/17/21      PT LONG TERM GOAL #4   Title Patient will increase L shoulder strength to 4-/5 in all planes.    Baseline Strength 3-/5,    Time 8    Period Weeks    Status New    Target Date 11/17/21      PT LONG TERM GOAL #5   Title Improve L shoulder AROM sufficiently to reach behind head for self care activities.    Baseline elevates to 106 flexion, 90 abd    Time 8    Period Weeks    Status New    Target Date 11/17/21                   Plan - 10/13/21 1753     Clinical Impression Statement Manuel Berger arrives today feeling well, continues to have knee and shoulder pain. Continued working on joint mobs to B feet which remain quite stiff but responded well to manual, then reviewed  additions to HEP (piriformis stretches and scap retractions), both of which he was able to perform well today. Has very rounded shoulders and habitual poor posture which will likely take some time to resolve. Also had quite a bit of pain in anterior left shoulder, I found multiple large trigger points and was able to reduce these by about 90% with STM, able to move thru WNL flexion and abduction L shoulder afterwards- might benefit from DN next session.    Personal Factors and Comorbidities Past/Current Experience;Comorbidity 1;Profession;Age    Comorbidities OA    Examination-Activity Limitations Reach Overhead;Locomotion Level;Bend;Sleep;Squat;Dressing;Hygiene/Grooming;Stand;Stairs;Lift    Examination-Participation Restrictions Occupation;Yard Work    Stability/Clinical Decision Making Stable/Uncomplicated    Clinical Decision Making Moderate    Rehab Potential Good    PT Frequency 2x / week    PT Duration 8 weeks    PT Treatment/Interventions ADLs/Self Care Home Management;Iontophoresis 4mg /ml Dexamethasone;Gait training;Neuromuscular re-education;Manual techniques;Joint Manipulations;Stair training;Moist Heat;Traction;Cryotherapy;Ultrasound; ;Therapeutic exercise;Therapeutic activities;Functional mobility training;Patient/family education;Orthotic Fit/Training    PT Next Visit Plan introduce more strengthening and mobility work- trial of DN to L pec and upper trap    PT Home Exercise Plan [facility]    Consulted and Agree with Plan of Care Patient             Patient will benefit from skilled therapeutic intervention in order to improve the following deficits and impairments:  Decreased range of motion, Difficulty walking, Increased fascial restricitons, Increased muscle spasms, Decreased activity tolerance, Pain, Decreased balance, Decreased mobility, Decreased strength, Postural dysfunction, Impaired flexibility  Visit Diagnosis: Localized  edema  Muscle weakness (generalized)  Chronic pain of right knee  Chronic left shoulder pain  Chronic pain of left knee     Problem List Patient Active Problem List   Diagnosis Date Noted  Transaminitis 08/23/2021   Cirrhosis of liver without ascites (HCC) 04/12/2018   Hepatic cyst 09/07/2017   Great toe pain 06/07/2017   Abnormal TSH 02/02/2017   Bilateral ganglion cysts of wrists 01/25/2016   Bilateral knee pain 10/30/2014   Lower back pain 07/25/2013   Right ankle sprain 03/24/2011   Hyperglycemia 03/23/2011   Preventative health care 03/23/2011   OSTEOARTHRITIS, KNEES, BILATERAL 04/30/2008   BURSITIS, LEFT HIP 04/30/2008   Chronic hepatitis C without hepatic coma (HCC) 12/05/2007   Hyperlipidemia 12/05/2007   Allergic rhinitis 12/05/2007   Lerry Liner PT, DPT, PN2   Supplemental Physical Therapist [place]    Pager [phone] Acute Rehab Office [phone]   [facility] [address]. [place], [place], [address] Phone: [phone]   Fax:  [phone]  Name: Manuel Berger MRN: [id number] Date of Birth: 1956/10/12

## 2021-10-19 ENCOUNTER — Ambulatory Visit: Payer: Medicare Other | Attending: Physician Assistant | Admitting: Physical Therapy

## 2021-10-19 ENCOUNTER — Other Ambulatory Visit: Payer: Self-pay

## 2021-10-19 ENCOUNTER — Encounter: Payer: Self-pay | Admitting: Physical Therapy

## 2021-10-19 DIAGNOSIS — M25561 Pain in right knee: Secondary | ICD-10-CM | POA: Insufficient documentation

## 2021-10-19 DIAGNOSIS — R600 Localized edema: Secondary | ICD-10-CM | POA: Diagnosis present

## 2021-10-19 DIAGNOSIS — M25562 Pain in left knee: Secondary | ICD-10-CM | POA: Insufficient documentation

## 2021-10-19 DIAGNOSIS — G8929 Other chronic pain: Secondary | ICD-10-CM | POA: Insufficient documentation

## 2021-10-19 DIAGNOSIS — M25512 Pain in left shoulder: Secondary | ICD-10-CM | POA: Diagnosis present

## 2021-10-19 DIAGNOSIS — M6281 Muscle weakness (generalized): Secondary | ICD-10-CM | POA: Diagnosis present

## 2021-10-19 NOTE — Therapy (Signed)
[facility] [address] [place], [place], [address] Phone: [phone]   Fax:  [phone]  Physical Therapy Treatment  Patient Details  Name: Manuel Berger MRN: [id number] Date of Birth: 1956-01-12 Referring Provider (PT): Virgilio Belling, [place]   Encounter Date: 10/19/2021   PT End of Session - 10/19/21 1838     Visit Number 4    Number of Visits 17    Date for PT Re-Evaluation 11/17/21    PT Start Time 1705    PT Stop Time 1747    PT Time Calculation (min) 42 min    Activity Tolerance Patient tolerated treatment well    Behavior During Therapy [facility] for tasks assessed/performed             Past Medical History:  Diagnosis Date   ALLERGIC RHINITIS 12/05/2007   BURSITIS, LEFT HIP 04/30/2008   GLUCOSE INTOLERANCE 12/05/2007   HEPATITIS C 12/05/2007   HYPERLIPIDEMIA 12/05/2007   Impaired glucose tolerance 03/23/2011   KNEE PAIN, BILATERAL 03/09/2010   OSTEOARTHRITIS, KNEES, BILATERAL 04/30/2008    History reviewed. No pertinent surgical history.  There were no vitals filed for this visit.   Subjective Assessment - 10/19/21 1715     Subjective Patient reports that he is doing well, still hurting more in the left shoulder.    Currently in Pain? Yes    Pain Score 3     Pain Location Shoulder    Pain Orientation Left    Aggravating Factors  lifting                               [facility] Adult PT Treatment/Exercise - 10/19/21 0001       Knee/Hip Exercises: Stretches   Passive Hamstring Stretch Both;4 reps;20 seconds    Piriformis Stretch Both;30 seconds;4 reps      Knee/Hip Exercises: Aerobic   Nustep L5 x 6 minutes.      Knee/Hip Exercises: Machines for Strengthening   Cybex Knee Extension 10# 2x10    Cybex Knee Flexion 25# 2x10    Other Machine 25# rows and lats 2x10, 10# straight arm pulls cues for posture 2x10      Knee/Hip Exercises: Supine   Other Supine Knee/Hip Exercises feet on  ball K2C, trunk roation, bridges and isometric abs      Shoulder Exercises: Standing   Horizontal ABduction Both;20 reps;Theraband    Theraband Level (Shoulder Horizontal ABduction) Level 3 (Green)    External Rotation Both;20 reps;Theraband    Theraband Level (Shoulder External Rotation) Level 3 (Green)      Shoulder Exercises: Stretch   Corner Stretch 4 reps;10 seconds                       PT Short Term Goals - 09/29/21 1918       PT SHORT TERM GOAL #1   Title I with basic HEP    Baseline Initiated    Time 3    Period Weeks    Status On-going    Target Date 10/20/21               PT Long Term Goals - 10/19/21 1840       PT LONG TERM GOAL #1   Title I with final HEP    Status On-going      PT LONG TERM GOAL #2   Title Tug < 12 seconds  Status On-going                   Plan - 10/19/21 1839     Clinical Impression Statement Patient with no real c/o pain, reports that he just feels tight and weak at times.  I added more gym strengthening type exercises, needed cues for form.  I did educate him on the need for anterior flexibility and posterior strength, I did some core stability, he is very tight in the anterior shoulders, HS and piriformis mms    PT Next Visit Plan introduce more strengthening and mobility work- trial of DN to L pec and upper trap    Consulted and Agree with Plan of Care Patient             Patient will benefit from skilled therapeutic intervention in order to improve the following deficits and impairments:  Decreased range of motion, Difficulty walking, Increased fascial restricitons, Increased muscle spasms, Decreased activity tolerance, Pain, Decreased balance, Decreased mobility, Decreased strength, Postural dysfunction, Impaired flexibility  Visit Diagnosis: Localized edema  Muscle weakness (generalized)  Chronic pain of right knee  Chronic left shoulder pain  Chronic pain of left knee     Problem  List Patient Active Problem List   Diagnosis Date Noted   Transaminitis 08/23/2021   Cirrhosis of liver without ascites (HCC) 04/12/2018   Hepatic cyst 09/07/2017   Great toe pain 06/07/2017   Abnormal TSH 02/02/2017   Bilateral ganglion cysts of wrists 01/25/2016   Bilateral knee pain 10/30/2014   Lower back pain 07/25/2013   Right ankle sprain 03/24/2011   Hyperglycemia 03/23/2011   Preventative health care 03/23/2011   OSTEOARTHRITIS, KNEES, BILATERAL 04/30/2008   BURSITIS, LEFT HIP 04/30/2008   Chronic hepatitis C without hepatic coma (HCC) 12/05/2007   Hyperlipidemia 12/05/2007   Allergic rhinitis 12/05/2007    Jearld Lesch, PT 10/19/2021, 6:41 PM  [facility] [address] [place], [place], [address] Phone: [phone]   Fax:  [phone]  Name: Manuel Berger MRN: [id number] Date of Birth: 10/21/1956

## 2021-10-21 ENCOUNTER — Encounter: Payer: Medicare Other | Admitting: Physical Therapy

## 2021-10-26 ENCOUNTER — Encounter: Payer: Self-pay | Admitting: Physical Therapy

## 2021-10-26 ENCOUNTER — Ambulatory Visit: Payer: Medicare Other | Admitting: Physical Therapy

## 2021-10-26 ENCOUNTER — Other Ambulatory Visit: Payer: Self-pay

## 2021-10-26 DIAGNOSIS — M25561 Pain in right knee: Secondary | ICD-10-CM

## 2021-10-26 DIAGNOSIS — M6281 Muscle weakness (generalized): Secondary | ICD-10-CM

## 2021-10-26 DIAGNOSIS — R600 Localized edema: Secondary | ICD-10-CM

## 2021-10-26 DIAGNOSIS — G8929 Other chronic pain: Secondary | ICD-10-CM

## 2021-10-26 DIAGNOSIS — M25562 Pain in left knee: Secondary | ICD-10-CM

## 2021-10-26 NOTE — Therapy (Signed)
[place]. [place], [place], [address] Phone: [phone]   Fax:  [phone]  Physical Therapy Treatment  Patient Details  Name: Manuel Berger MRN: [id number] Date of Birth: 1956-07-11 Referring Provider (PT): Andria Frames, [place]   Encounter Date: 10/26/2021   PT End of Session - 10/26/21 1749     Visit Number 5    Number of Visits 17    Date for PT Re-Evaluation 11/17/21    PT Start Time 1735    PT Stop Time 1750    PT Time Calculation (min) 45 min    Activity Tolerance Patient tolerated treatment well    Behavior During Therapy [facility] for tasks assessed/performed             Past Medical History:  Diagnosis Date   ALLERGIC RHINITIS 12/05/2007   BURSITIS, LEFT HIP 04/30/2008   GLUCOSE INTOLERANCE 12/05/2007   HEPATITIS C 12/05/2007   HYPERLIPIDEMIA 12/05/2007   Impaired glucose tolerance 03/23/2011   KNEE PAIN, BILATERAL 03/09/2010   OSTEOARTHRITIS, KNEES, BILATERAL 04/30/2008    History reviewed. No pertinent surgical history.  There were no vitals filed for this visit.   Subjective Assessment - 10/26/21 1710     Subjective Doing okay, felt like the stretches were good    Currently in Pain? Yes    Pain Score 3     Pain Location Shoulder    Pain Orientation Left    Pain Descriptors / Indicators Nagging;Tightness    Pain Relieving Factors stretches felt good                               [facility] Adult PT Treatment/Exercise - 10/26/21 0001       Knee/Hip Exercises: Stretches   Passive Hamstring Stretch Both;4 reps;20 seconds    Piriformis Stretch Both;30 seconds;4 reps      Knee/Hip Exercises: Aerobic   Nustep L5 x 6 minutes.      Knee/Hip Exercises: Machines for Strengthening   Cybex Knee Extension 10# 2x10    Cybex Knee Flexion 25# 2x10    Cybex Leg Press 40# x10, 60# 2x10    Other Machine 25# rows and lats 2x10, 10# straight arm pulls cues for posture 2x10, 25#  biceps, 35# triceps, upper back cross 5#, 5# ER      Knee/Hip Exercises: Supine   Other Supine Knee/Hip Exercises feet on ball K2C, trunk roation, bridges and isometric abs      Shoulder Exercises: Stretch   Cross Chest Stretch 3 reps;10 seconds    Star Gazer Stretch 3 reps;10 seconds      Manual Therapy   Manual Therapy Taping    Kinesiotex Engineering geologist Space 3 way pull up to support the shoulder                       PT Short Term Goals - 09/29/21 1918       PT SHORT TERM GOAL #1   Title I with basic HEP    Baseline Initiated    Time 3    Period Weeks    Status On-going    Target Date 10/20/21               PT Long Term Goals - 10/26/21 1751       PT LONG TERM GOAL #1  Title I with final HEP    Status On-going      PT LONG TERM GOAL #2   Title Tug < 12 seconds    Status On-going      PT LONG TERM GOAL #3   Title 5 x STS in < 10 seconds    Status On-going      PT LONG TERM GOAL #4   Title Patient will increase L shoulder strength to 4-/5 in all planes.    Status Partially Met                   Plan - 10/26/21 1749     Clinical Impression Statement Really tolerates the exercises well and reports that he feels it is making him stronger, he is weak with ER, tight anterior shoulder. Cues needed for posture during exercises.  I tried taping to help support the shoulder 3 way upward pull    PT Next Visit Plan continue to progress, see how the tape did    Consulted and Agree with Plan of Care Patient             Patient will benefit from skilled therapeutic intervention in order to improve the following deficits and impairments:  Decreased range of motion, Difficulty walking, Increased fascial restricitons, Increased muscle spasms, Decreased activity tolerance, Pain, Decreased balance, Decreased mobility, Decreased strength, Postural dysfunction, Impaired flexibility  Visit Diagnosis: Localized  edema  Muscle weakness (generalized)  Chronic pain of right knee  Chronic left shoulder pain  Chronic pain of left knee     Problem List Patient Active Problem List   Diagnosis Date Noted   Transaminitis 08/23/2021   Cirrhosis of liver without ascites ([place]) 04/12/2018   Hepatic cyst 09/07/2017   Great toe pain 06/07/2017   Abnormal TSH 02/02/2017   Bilateral ganglion cysts of wrists 01/25/2016   Bilateral knee pain 10/30/2014   Lower back pain 07/25/2013   Right ankle sprain 03/24/2011   Hyperglycemia 03/23/2011   Preventative health care 03/23/2011   OSTEOARTHRITIS, KNEES, BILATERAL 04/30/2008   BURSITIS, LEFT HIP 04/30/2008   Chronic hepatitis C without hepatic coma ([place]) 12/05/2007   Hyperlipidemia 12/05/2007   Allergic rhinitis 12/05/2007    Manuel Berger, PT 10/26/2021, 5:51 PM  [place]. [place], [place], [address] Phone: [phone]   Fax:  [phone]  Name: Manuel Berger MRN: [id number] Date of Birth: 1956/01/04

## 2021-10-27 ENCOUNTER — Ambulatory Visit: Payer: Medicare Other | Admitting: Physical Therapy

## 2021-10-27 ENCOUNTER — Encounter: Payer: Self-pay | Admitting: Physical Therapy

## 2021-10-27 DIAGNOSIS — G8929 Other chronic pain: Secondary | ICD-10-CM

## 2021-10-27 DIAGNOSIS — M25562 Pain in left knee: Secondary | ICD-10-CM

## 2021-10-27 DIAGNOSIS — R600 Localized edema: Secondary | ICD-10-CM | POA: Diagnosis not present

## 2021-10-27 DIAGNOSIS — M6281 Muscle weakness (generalized): Secondary | ICD-10-CM

## 2021-10-27 DIAGNOSIS — M25561 Pain in right knee: Secondary | ICD-10-CM

## 2021-10-27 NOTE — Patient Instructions (Signed)
Access Code: [facility] URL: [email or web address] Date: 10/27/2021 Prepared by: Oley Balm  Exercises Supine Bridge with Resistance Band - 1 x daily - 7 x weekly - 2 sets - 10 reps Supine Hamstring Stretch - 1 x daily - 7 x weekly - 3 sets - 10 hold Supine Piriformis Stretch with Leg Straight - 1 x daily - 7 x weekly - 3 sets - 10 hold Single Knee to Chest Stretch - 1 x daily - 7 x weekly - 3 sets - 10 hold Supine Double Knee to Chest - 1 x daily - 7 x weekly - 3 sets - 10 hold Squat with Chair Touch - 1 x daily - 7 x weekly - 2 sets - 10 reps Forward Monster Walks - 1 x daily - 7 x weekly - 2 sets - 10 reps Supine Piriformis Stretch Pulling Heel to Hip - 1 x daily - 7 x weekly - 3 sets - 10 reps Eccentric Heel Lowering on Step - 1 x daily - 7 x weekly - 2 sets - 10 reps Standing Gastroc Stretch on Step - 1 x daily - 7 x weekly - 3 sets - 10 hold Standing Soleus Stretch on Step - 1 x daily - 7 x weekly - 3 sets - 10 hold Standing Shoulder Horizontal Abduction with Resistance - 1 x daily - 7 x weekly - 2 sets - 10 reps Doorway Pec Stretch at 90 Degrees Abduction - 1 x daily - 7 x weekly - 3 sets - 10 hold Shoulder External Rotation and Scapular Retraction with Resistance - 1 x daily - 7 x weekly - 2 sets - 10 reps Shoulder Extension with Resistance - 1 x daily - 7 x weekly - 2 sets - 10 reps Standing Bilateral Low Shoulder Row with Anchored Resistance - 1 x daily - 7 x weekly - 2 sets - 10 reps

## 2021-10-27 NOTE — Therapy (Signed)
[place]. [place], [place], [address] Phone: [phone]   Fax:  [phone]  Physical Therapy Treatment  Patient Details  Name: Manuel Berger MRN: [id number] Date of Birth: 04-Feb-1956 Referring Provider (PT): Andria Frames, [place]   Encounter Date: 10/27/2021   PT End of Session - 10/27/21 1754     Visit Number 6    Number of Visits 17    Date for PT Re-Evaluation 11/17/21    PT Start Time 1594    PT Stop Time 1753    PT Time Calculation (min) 38 min    Activity Tolerance Patient tolerated treatment well    Behavior During Therapy [facility] for tasks assessed/performed             Past Medical History:  Diagnosis Date   ALLERGIC RHINITIS 12/05/2007   BURSITIS, LEFT HIP 04/30/2008   GLUCOSE INTOLERANCE 12/05/2007   HEPATITIS C 12/05/2007   HYPERLIPIDEMIA 12/05/2007   Impaired glucose tolerance 03/23/2011   KNEE PAIN, BILATERAL 03/09/2010   OSTEOARTHRITIS, KNEES, BILATERAL 04/30/2008    History reviewed. No pertinent surgical history.  There were no vitals filed for this visit.   Subjective Assessment - 10/27/21 1717     Subjective Shoulder feels better. he still has Kinesiotape in place and it feels supportive.    Pertinent History OA    Limitations Lifting;Walking;Standing    How long can you sit comfortably? N/A    How long can you stand comfortably? 15-20 minutes.    How long can you walk comfortably? He has limited walking due to fear of pain.    Diagnostic tests X rays taken a few years ago showed OA.    Patient Stated Goals Be able to walk his dog, lift things with his LUE.    Pain Onset More than a month ago                               [facility] Adult PT Treatment/Exercise - 10/27/21 0001       Knee/Hip Exercises: Stretches   Other Knee/Hip Stretches gastroc/soleus stretch on step 3 x 10 sec each      Knee/Hip Exercises: Aerobic   Stationary Bike l4 x 5 minutes       Knee/Hip Exercises: Standing   Other Standing Knee Exercises Monster walks iwth G theraband    Other Standing Knee Exercises Eccentric calf strengthening. 1 x 10 reps each      Shoulder Exercises: Standing   External Rotation Both;10 reps;Theraband    Theraband Level (Shoulder External Rotation) Level 3 (Green)    Extension Strengthening;Both;10 reps;Theraband    Theraband Level (Shoulder Extension) Level 3 (Green)    Row Both;10 reps;Theraband    Theraband Level (Shoulder Row) Level 3 (Green)      Manual Therapy   Manual Therapy Soft tissue mobilization;Passive ROM;Scapular mobilization;Joint mobilization    Joint Mobilization humeral depression and ant glide 10 reps, grade 3    Soft tissue mobilization B upper traps    Scapular Mobilization L scapula into protraction/retractoin    Passive ROM cervical for upper traps, scalene stretch on R.                     PT Education - 10/27/21 1748     Education Details HEP updates    Person(s) Educated Patient    Methods Explanation;Demonstration;Handout    Comprehension Returned demonstration;Verbalized  understanding              PT Short Term Goals - 09/29/21 1918       PT SHORT TERM GOAL #1   Title I with basic HEP    Baseline Initiated    Time 3    Period Weeks    Status On-going    Target Date 10/20/21               PT Long Term Goals - 10/26/21 1751       PT LONG TERM GOAL #1   Title I with final HEP    Status On-going      PT LONG TERM GOAL #2   Title Tug < 12 seconds    Status On-going      PT LONG TERM GOAL #3   Title 5 x STS in < 10 seconds    Status On-going      PT LONG TERM GOAL #4   Title Patient will increase L shoulder strength to 4-/5 in all planes.    Status Partially Met                   Plan - 10/27/21 1755     Clinical Impression Statement Patient is responding well to treatment for knees and L shoulder. he feels the Kinesiotape is helping take some strain  off shoulder, he is sleepig better without throbbing pain. Provided additional strengthening and stretch to BLE and shoulders, updated HEP.    Personal Factors and Comorbidities Past/Current Experience;Comorbidity 1;Profession;Age    Comorbidities OA    Examination-Activity Limitations Reach Overhead;Locomotion Level;Bend;Sleep;Squat;Dressing;Hygiene/Grooming;Stand;Stairs;Lift    Examination-Participation Restrictions Occupation;Yard Work    Stability/Clinical Decision Making Stable/Uncomplicated    Clinical Decision Making Moderate    Rehab Potential Good    PT Frequency 2x / week    PT Duration 6 weeks    PT Treatment/Interventions ADLs/Self Care Home Management;Iontophoresis 69m/ml Dexamethasone;Gait training;Neuromuscular re-education;Manual techniques;Joint Manipulations;Stair training;Moist Heat;Traction;Cryotherapy;Ultrasound;ELobbyistTherapeutic exercise;Therapeutic activities;Functional mobility training;Patient/family education;Orthotic Fit/Training    PT Next Visit Plan Assess for more stretching to upper traps, DN? wall slides for strenght.    Consulted and Agree with Plan of Care Patient             Patient will benefit from skilled therapeutic intervention in order to improve the following deficits and impairments:  Decreased range of motion, Difficulty walking, Increased fascial restricitons, Increased muscle spasms, Decreased activity tolerance, Pain, Decreased balance, Decreased mobility, Decreased strength, Postural dysfunction, Impaired flexibility  Visit Diagnosis: Localized edema  Muscle weakness (generalized)  Chronic pain of right knee  Chronic left shoulder pain  Chronic pain of left knee     Problem List Patient Active Problem List   Diagnosis Date Noted   Transaminitis 08/23/2021   Cir[place] ascites ([place] 04/12/2018   Hepatic cyst 09/07/2017   Great toe pain 06/07/2017   Abnormal TSH 02/02/2017    Bilateral ganglion cysts of wrists 01/25/2016   Bilateral knee pain 10/30/2014   Lower back pain 07/25/2013   Right ankle sprain 03/24/2011   Hyperglycemia 03/23/2011   Preventative health care 03/23/2011   OSTEOARTHRITIS, KNEES, BILATERAL 04/30/2008   BURSITIS, LEFT HIP 04/30/2008   Chronic hepatitis C without hepatic coma ([place] 12/05/2007   Hyperlipidemia 12/05/2007   Allergic rhinitis 12/05/2007    SMarcelina Morel DPT 10/27/2021, 5:59 PM  [place] [address]Phone: [phone]  Fax:  [phone]  Name: Manuel Berger MRN: [id number] Date of Birth: Apr 03, 1956

## 2021-11-01 ENCOUNTER — Ambulatory Visit: Payer: Medicare Other | Admitting: Physical Therapy

## 2021-11-01 ENCOUNTER — Other Ambulatory Visit: Payer: Self-pay

## 2021-11-01 ENCOUNTER — Encounter: Payer: Self-pay | Admitting: Physical Therapy

## 2021-11-01 DIAGNOSIS — M6281 Muscle weakness (generalized): Secondary | ICD-10-CM

## 2021-11-01 DIAGNOSIS — M25561 Pain in right knee: Secondary | ICD-10-CM

## 2021-11-01 DIAGNOSIS — R600 Localized edema: Secondary | ICD-10-CM | POA: Diagnosis not present

## 2021-11-01 DIAGNOSIS — M25562 Pain in left knee: Secondary | ICD-10-CM

## 2021-11-01 DIAGNOSIS — G8929 Other chronic pain: Secondary | ICD-10-CM

## 2021-11-01 NOTE — Therapy (Signed)
[place]. [place], [place], [address] Phone: [phone]   Fax:  [phone]  Physical Therapy Treatment  Patient Details  Name: Manuel Berger MRN: [id number] Date of Birth: 05-24-1956 Referring Provider (PT): Andria Frames, [place]   Encounter Date: 11/01/2021   PT End of Session - 11/01/21 1750     Visit Number 7    Number of Visits 17    Date for PT Re-Evaluation 11/17/21    PT Start Time 1702    PT Stop Time 1741    PT Time Calculation (min) 39 min    Activity Tolerance Patient tolerated treatment well    Behavior During Therapy [facility] for tasks assessed/performed             Past Medical History:  Diagnosis Date   ALLERGIC RHINITIS 12/05/2007   BURSITIS, LEFT HIP 04/30/2008   GLUCOSE INTOLERANCE 12/05/2007   HEPATITIS C 12/05/2007   HYPERLIPIDEMIA 12/05/2007   Impaired glucose tolerance 03/23/2011   KNEE PAIN, BILATERAL 03/09/2010   OSTEOARTHRITIS, KNEES, BILATERAL 04/30/2008    History reviewed. No pertinent surgical history.  There were no vitals filed for this visit.   Subjective Assessment - 11/01/21 1703     Subjective Shoulder is feeling better, picking up heavy stuff is hard for me to do like 75-100# is tough. My knees are doing a whole lot better too.    Pertinent History OA    Limitations Lifting;Walking;Standing    Patient Stated Goals Be able to walk his dog, lift things with his LUE.    Currently in Pain? Yes    Pain Location Knee    Pain Orientation Left    Pain Descriptors / Indicators Nagging;Tightness    Pain Type Chronic pain                               [facility] Adult PT Treatment/Exercise - 11/01/21 0001       Knee/Hip Exercises: Aerobic   Nustep L5 x6 minutes BLEs only      Knee/Hip Exercises: Standing   Heel Raises Both;1 set;20 reps    Forward Lunges Both;1 set;10 reps    Forward Lunges Limitations 6 inch step    Lateral Step Up Both;1 set;10  reps    Lateral Step Up Limitations 4 inch step    Forward Step Up Both;1 set;15 reps    Forward Step Up Limitations 4 inch step intermittent UE support    Step Down Both;1 set;10 reps    Step Down Limitations 4 inch step    Other Standing Knee Exercises hip hikes with and wihtout swing  1x10 each      Knee/Hip Exercises: Supine   Bridges --    Single Leg Bridge Right;Left;1 set;10 reps      Knee/Hip Exercises: Sidelying   Hip ABduction Both;1 set;15 reps      Knee/Hip Exercises: Prone   Hamstring Curl --    Hip Extension Both;1 set;15 reps      Manual Therapy   Manual Therapy Taping      [employer]   Create Space taping to support the shoulder/help facilitate delts                     PT Education - 11/01/21 1750     Education Details reassess/possible DC next session    Person(s) Educated Patient    Methods Explanation  Comprehension Verbalized understanding;Returned demonstration              PT Short Term Goals - 09/29/21 1918       PT SHORT TERM GOAL #1   Title I with basic HEP    Baseline Initiated    Time 3    Period Weeks    Status On-going    Target Date 10/20/21               PT Long Term Goals - 10/26/21 1751       PT LONG TERM GOAL #1   Title I with final HEP    Status On-going      PT LONG TERM GOAL #2   Title Tug < 12 seconds    Status On-going      PT LONG TERM GOAL #3   Title 5 x STS in < 10 seconds    Status On-going      PT LONG TERM GOAL #4   Title Patient will increase L shoulder strength to 4-/5 in all planes.    Status Partially Met                   Plan - 11/01/21 1750     Clinical Impression Statement Mujahid arrives today doing well- reports his shoulder is much better, knees ache a little but are doing well overall too. Very close to where hed like to be functionally. Retaped his shoulder, otherwise focused on functional strength for BLEs per his request today. Will need to reassess next  session- seems to be very close to being ready for DC!    Personal Factors and Comorbidities Past/Current Experience;Comorbidity 1;Profession;Age    Comorbidities OA    Examination-Activity Limitations Reach Overhead;Locomotion Level;Bend;Sleep;Squat;Dressing;Hygiene/Grooming;Stand;Stairs;Lift    Examination-Participation Restrictions Occupation;Yard Work    Stability/Clinical Decision Making Stable/Uncomplicated    Clinical Decision Making Moderate    Rehab Potential Good    PT Frequency 2x / week    PT Duration 6 weeks    PT Treatment/Interventions ADLs/Self Care Home Management;Iontophoresis 76m/ml Dexamethasone;Gait training;Neuromuscular re-education;Manual techniques;Joint Manipulations;Stair training;Moist Heat;Traction;Cryotherapy;Ultrasound;ELobbyistTherapeutic exercise;Therapeutic activities;Functional mobility training;Patient/family education;Orthotic Fit/Training    PT Next Visit Plan reassess/possible DC    [facility]   Consulted and Agree with Plan of Care Patient             Patient will benefit from skilled therapeutic intervention in order to improve the following deficits and impairments:  Decreased range of motion, Difficulty walking, Increased fascial restricitons, Increased muscle spasms, Decreased activity tolerance, Pain, Decreased balance, Decreased mobility, Decreased strength, Postural dysfunction, Impaired flexibility  Visit Diagnosis: Muscle weakness (generalized)  Chronic pain of right knee  Chronic left shoulder pain  Chronic pain of left knee  Localized edema     Problem List Patient Active Problem List   Diagnosis Date Noted   Transaminitis 08/23/2021   Cir[place] of [place] without [place]ites ([place] 04/12/2018   Hepatic cyst 09/07/2017   Great toe pain 06/07/2017   Abnormal TSH 02/02/2017   Bilateral ganglion cysts of wrists 01/25/2016   Bilateral knee pain 10/30/2014   Lower back pain  07/25/2013   Right ankle sprain 03/24/2011   Hyperglycemia 03/23/2011   Preventative health care 03/23/2011   OSTEOARTHRITIS, KNEES, BILATERAL 04/30/2008   BURSITIS, LEFT HIP 04/30/2008   Ch[place] C [place] hepatic coma ([place] 12/05/2007   Hyperlipidemia 12/05/2007   Allergic rhinitis 12/05/2007   Kinjal Neitzke U PT, DPT, PN2   Supplemental  Physical Therapist [place]    Pager [phone] Acute Rehab Office [place]. [place], [place], [address] Phone: [phone]   Fax:  [phone]  Name: KRIST ROSENBOOM MRN: [id number] Date of Birth: 05-Sep-1956

## 2021-11-03 ENCOUNTER — Encounter: Payer: Self-pay | Admitting: Physical Therapy

## 2021-11-03 ENCOUNTER — Ambulatory Visit: Payer: Medicare Other | Admitting: Physical Therapy

## 2021-11-03 ENCOUNTER — Other Ambulatory Visit: Payer: Self-pay

## 2021-11-03 DIAGNOSIS — M6281 Muscle weakness (generalized): Secondary | ICD-10-CM

## 2021-11-03 DIAGNOSIS — G8929 Other chronic pain: Secondary | ICD-10-CM

## 2021-11-03 DIAGNOSIS — M25562 Pain in left knee: Secondary | ICD-10-CM

## 2021-11-03 DIAGNOSIS — M25561 Pain in right knee: Secondary | ICD-10-CM

## 2021-11-03 DIAGNOSIS — R600 Localized edema: Secondary | ICD-10-CM | POA: Diagnosis not present

## 2021-11-03 NOTE — Therapy (Addendum)
[place]. [place], [place], [address] Phone: [phone]   Fax:  [phone]  Physical Therapy Treatment  Patient Details  Name: Manuel Berger MRN: [id number] Date of Birth: 05-25-1956 Referring Provider (PT): Andria Frames, PA   PHYSICAL THERAPY DISCHARGE SUMMARY  Visits from Start of Care: 8  Current functional level related to goals / functional outcomes: Manuel Berger has made excellent progress with PT- working on building advanced strength on power tower at home, also compliant with advanced HEP from [place]. Does have on and off knee pain but reports this is tolerable and variable. Very pleased with progress and feels he is ready for DC.    Remaining deficits: See below    Education / Equipment: See below    Patient agrees to discharge. Patient goals were met. Patient is being discharged due to meeting the stated rehab goals.   Encounter Date: 11/03/2021   PT End of Session - 11/03/21 1730     Visit Number 8    Number of Visits 8    Date for PT Re-Evaluation 11/03/21    PT Start Time 6962    PT Stop Time 1726    PT Time Calculation (min) 38 min    Activity Tolerance Patient tolerated treatment well    Behavior During Therapy [facility] for tasks assessed/performed             Past Medical History:  Diagnosis Date   ALLERGIC RHINITIS 12/05/2007   BURSITIS, LEFT HIP 04/30/2008   GLUCOSE INTOLERANCE 12/05/2007   HEPATITIS C 12/05/2007   HYPERLIPIDEMIA 12/05/2007   Impaired glucose tolerance 03/23/2011   KNEE PAIN, BILATERAL 03/09/2010   OSTEOARTHRITIS, KNEES, BILATERAL 04/30/2008    History reviewed. No pertinent surgical history.  There were no vitals filed for this visit.   Subjective Assessment - 11/03/21 1655     Subjective I felt really good after last session- still have little aches and pains but doing well. Tape did good, I took it off bc it started itching. Shoulder feels great!    Pertinent  History OA    How long can you stand comfortably? 25-30 minutes    How long can you walk comfortably? sometimes it doesn't even bother me    Diagnostic tests X rays taken a few years ago showed OA.    Patient Stated Goals Be able to walk his dog, lift things with his LUE.    Currently in Pain? Yes    Pain Score 4     Pain Location Knee    Pain Orientation Right;Left    Pain Descriptors / Indicators Discomfort    Pain Type Chronic pain                [facility] PT Assessment - 11/03/21 0001       Assessment   Medical Diagnosis B knee L shoulder pain    Next MD Visit unsure      Balance Screen   Has the patient fallen in the past 6 months No    Has the patient had a decrease in activity level because of a fear of falling?  No    Is the patient reluctant to leave their home because of a fear of falling?  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    Leisure Walks his dog      Cognition   Overall Cognitive Status Within Functional Limits for tasks assessed      AROM   Right Shoulder Flexion 155 Degrees    Right Shoulder ABduction 170 Degrees    Right Shoulder Internal Rotation --   FIR T7   Right Shoulder External Rotation --   FER T3   Left Shoulder Flexion 155 Degrees    Left Shoulder ABduction 170 Degrees    Left Shoulder Internal Rotation --   FIR T7   Left Shoulder External Rotation --   FER T3     Strength   Right Shoulder Flexion 4+/5    Right Shoulder ABduction 4+/5    Left Shoulder Flexion 4+/5    Left Shoulder ABduction 4/5    Right Hip Flexion 4+/5    Right Hip ABduction 4+/5    Left Hip Flexion 4+/5    Left Hip ABduction 4+/5    Right Knee Flexion 4+/5    Right Knee Extension 4+/5    Left Knee Flexion 5/5    Left Knee Extension 5/5    Right Ankle Dorsiflexion 5/5     Left Ankle Dorsiflexion 5/5      Standardized Balance Assessment   Five times sit to stand comments  10.2                           [facility] Adult PT Treatment/Exercise - 11/03/21 0001       Knee/Hip Exercises: Aerobic   Nustep L5 x6 minutes BLEs only      Knee/Hip Exercises: Standing   Lateral Step Up Both;1 set;10 reps    Lateral Step Up Limitations 6 inch step    Forward Step Up Both;1 set;10 reps    Forward Step Up Limitations 6 inch step    Wall Squat 1 set;10 reps    Other Standing Knee Exercises hip hikes 1x10 B    Other Standing Knee Exercises --                     PT Education - 11/03/21 1730     Education Details DC today, HEP updates, if any new issues arise can return with new MD order. Importance of quality diet and recovery days to support exercise routine.    Person(s) Educated Patient    Methods Explanation;Handout    Comprehension Verbalized understanding;Returned demonstration              PT Short Term Goals - 11/03/21 1706       PT SHORT TERM GOAL #1   Title I with basic HEP    Time 3    Period Weeks    Status Achieved    Target Date 10/20/21               PT Long Term Goals - 11/03/21 1706       PT LONG TERM GOAL #1   Title I with final HEP    Time 8    Period Weeks    Status Achieved      PT LONG TERM GOAL #2   Title Tug < 12 seconds    Baseline 7.7    Time 8    Period Weeks    Status Achieved    Target Date 11/17/21      PT LONG TERM GOAL #3   Title  5 x STS in < 10 seconds    Baseline 10.2    Time 8    Period Weeks    Status Partially Met    Target Date 11/17/21      PT LONG TERM GOAL #4   Title Patient will increase L shoulder strength to 4-/5 in all planes.    Time 8    Period Weeks    Status Achieved      PT LONG TERM GOAL #5   Title Improve L shoulder AROM sufficiently to reach behind head for self care activities.    Time 8    Period Weeks    Status Achieved    Target Date  11/17/21                   Plan - 11/03/21 1731     Clinical Impression Statement Manuel Berger arrives today doing really well- still compliant with HEP and has started working out again on his power tower, really feeling good. Still has some knee aches and pains that come and go but overall feels he is happy with functional status. Made excellent progress on objective measures and towards goals! Took some time to add some closed chain exercises to HEP, otherwise ready for DC. Thank you for the opportunity to participate in his care!    Personal Factors and Comorbidities Past/Current Experience;Comorbidity 1;Profession;Age    Comorbidities OA    Examination-Activity Limitations Reach Overhead;Locomotion Level;Bend;Sleep;Squat;Dressing;Hygiene/Grooming;Stand;Stairs;Lift    Examination-Participation Restrictions Occupation;Yard Work    Stability/Clinical Decision Making Stable/Uncomplicated    Clinical Decision Making Moderate    Rehab Potential Good    PT Frequency 2x / week    PT Duration 6 weeks    PT Treatment/Interventions ADLs/Self Care Home Management;Iontophoresis 48m/ml Dexamethasone;Gait training;Neuromuscular re-education;Manual techniques;Joint Manipulations;Stair training;Moist Heat;Traction;Cryotherapy;Ultrasound;ELobbyistTherapeutic exercise;Therapeutic activities;Functional mobility training;Patient/family education;Orthotic Fit/Training    PT Next Visit Plan DC today    [facility]   Consulted and Agree with Plan of Care Patient             Patient will benefit from skilled therapeutic intervention in order to improve the following deficits and impairments:  Decreased range of motion, Difficulty walking, Increased fascial restricitons, Increased muscle spasms, Decreased activity tolerance, Pain, Decreased balance, Decreased mobility, Decreased strength, Postural dysfunction, Impaired flexibility  Visit Diagnosis: Muscle  weakness (generalized)  Chronic pain of right knee  Chronic left shoulder pain  Chronic pain of left knee     Problem List Patient Active Problem List   Diagnosis Date Noted   Transaminitis 08/23/2021   Cirrhosis of liver without ascites ([place] 04/12/2018   Hepatic cyst 09/07/2017   Great toe pain 06/07/2017   Abnormal TSH 02/02/2017   Bilateral ganglion cysts of wrists 01/25/2016   Bilateral knee pain 10/30/2014   Lower back pain 07/25/2013   Right ankle sprain 03/24/2011   Hyperglycemia 03/23/2011   Preventative health care 03/23/2011   OSTEOARTHRITIS, KNEES, BILATERAL 04/30/2008   BURSITIS, LEFT HIP 04/30/2008   Chronic hepatitis C without hepatic coma ([place] 12/05/2007   Hyperlipidemia 12/05/2007   Allergic rhinitis 12/05/2007   KAnn LionsPT, DPT, PN2   Supplemental Physical Therapist [place]   Pager [phone] Rehab Office [phone] 11/10/21 at [place]ed and reported a rash and some pain in the left shoulder, he reports that he had left the kinesiotape on for the 3 days and when he took it off he had a rash.  He was last taped on 11/01/21.   Deniece Ree, DPT and myself looked at it, it appears to follow the lines of the adhesive of the tape, neither of [place] has seen this before.  He allowed me to take picture of this, which I was later able to find the same type of rash on google as a reaction to the K-tape. I had taped him on 10/26/21 wihtout any rash or reaction.   We suggested a hydrocortisone cream and since he reported pain that he call his MD.   11/17/21  Patient called today, he saw the MD and got a cream that he is putting on the shoulder, he reports that he has no pain, he does report concern about the look of the skin as he reports that the MD says he does not think this will go away.   [place], [place]. [place], [place], [address] Phone:  [phone]   Fax:  [phone]  Name: Manuel Berger MRN: [id number] Date of Birth: 1956/03/03

## 2022-01-28 ENCOUNTER — Ambulatory Visit
Admission: RE | Admit: 2022-01-28 | Discharge: 2022-01-28 | Disposition: A | Discharge status: Home / Self Care | Payer: Medicare Other | Source: Ambulatory Visit | Attending: Internal Medicine | Admitting: Internal Medicine

## 2022-01-28 DIAGNOSIS — K7460 Unspecified cirrhosis of liver: Secondary | ICD-10-CM

## 2022-01-28 DIAGNOSIS — B182 Chronic viral hepatitis C: Secondary | ICD-10-CM

## 2022-01-31 ENCOUNTER — Telehealth: Payer: Self-pay

## 2022-01-31 NOTE — Telephone Encounter (Signed)
-----   Message from Gardiner Barefoot, MD sent at 01/31/2022  4:24 PM EDT ----- ?Please let him know his [place] looks fine, no new issues. thanks ?----- Message ----- ?From: Interface, Rad Results In ?Sent: 01/30/2022   9:50 PM EDT ?To: Gardiner Barefoot, MD ? ? ?

## 2022-08-23 ENCOUNTER — Encounter: Payer: Self-pay | Admitting: Internal Medicine

## 2022-08-23 ENCOUNTER — Ambulatory Visit (INDEPENDENT_AMBULATORY_CARE_PROVIDER_SITE_OTHER): Payer: Medicare Other | Admitting: Internal Medicine

## 2022-08-23 ENCOUNTER — Other Ambulatory Visit: Payer: Self-pay

## 2022-08-23 VITALS — BP 154/80 | HR 80 | Temp 98.6°F | Ht 66.0 in | Wt 187.0 lb

## 2022-08-23 DIAGNOSIS — K7689 Other specified diseases of liver: Secondary | ICD-10-CM

## 2022-08-23 DIAGNOSIS — K7460 Unspecified cirrhosis of liver: Secondary | ICD-10-CM

## 2022-08-23 NOTE — Progress Notes (Signed)
   Subjective:    Patient ID: Manuel Berger, male    DOB: [birth year], 66 y.o.   MRN: [id number]  HPI Here for follow up of cirrhosis He has had no issues since last year.  No alcohol.  Ultrasounds twice per year and due now.     Review of Systems  Constitutional:  Negative for fatigue.  Gastrointestinal:  Negative for diarrhea.  Skin:  Negative for rash.       Objective:   Physical Exam Eyes:     General: No scleral icterus. Neurological:     Mental Status: He is alert and oriented to person, place, and time.  Psychiatric:        Mood and Affect: Mood normal.           Assessment & Plan:

## 2022-08-23 NOTE — Assessment & Plan Note (Signed)
Stable cyst on ultrasounds.

## 2022-08-23 NOTE — Assessment & Plan Note (Signed)
No issues on ultrasounds for [place] screening and no concerns at this point,  I reviewed the ultrasounds with him  Will schedule q 6 months and rtc in 1 year

## 2022-08-25 ENCOUNTER — Other Ambulatory Visit: Payer: Medicare Other

## 2022-08-25 ENCOUNTER — Ambulatory Visit
Admission: RE | Admit: 2022-08-25 | Discharge: 2022-08-25 | Disposition: A | Discharge status: Home / Self Care | Payer: Medicare Other | Source: Ambulatory Visit | Attending: Internal Medicine | Admitting: Internal Medicine

## 2022-08-25 DIAGNOSIS — K7460 Unspecified cirrhosis of liver: Secondary | ICD-10-CM

## 2022-08-29 ENCOUNTER — Telehealth: Payer: Self-pay

## 2022-08-29 NOTE — Telephone Encounter (Signed)
-----   Message from Thayer Headings, MD sent at 08/26/2022  1:45 PM EDT ----- Please let him know his ultrasound looks stable.  No issues.  thanks ----- Message ----- From: Interface, Rad Results In Sent: 08/26/2022   6:25 AM EDT To: Thayer Headings, MD

## 2022-08-29 NOTE — Telephone Encounter (Signed)
Called and spoke with patient. Relayed Dr. Henreitta Leber message verbatim, "his ultrasound looks stable.  No issues". Patient verbalized understanding and had no questions.  Binnie Kand, RN

## 2023-02-23 ENCOUNTER — Ambulatory Visit
Admission: RE | Admit: 2023-02-23 | Discharge: 2023-02-23 | Disposition: A | Discharge status: Home / Self Care | Payer: Medicare Other | Source: Ambulatory Visit | Attending: Internal Medicine | Admitting: Internal Medicine

## 2023-02-23 DIAGNOSIS — K7460 Unspecified cirrhosis of liver: Secondary | ICD-10-CM

## 2023-04-15 IMAGING — US US ABDOMEN LIMITED
1 series · 14 of 25 positions shown · non-contrast
Comparison: Ultrasound 07/29/2020.

CLINICAL DATA: Cirrhosis.  HCC screening.

EXAM:
ULTRASOUND ABDOMEN LIMITED RIGHT UPPER QUADRANT

[Series 1: us abdomen limited · 0.25mm/px · 14 of 37 slices shown]
[im 1/37]
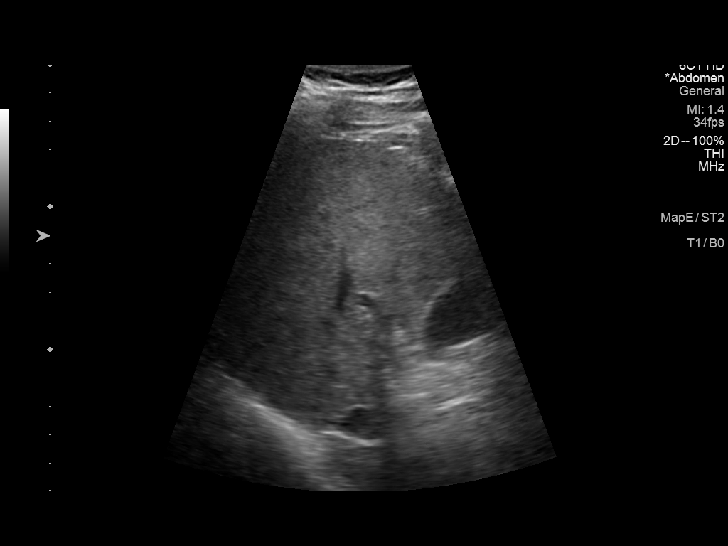
[im 4/37]
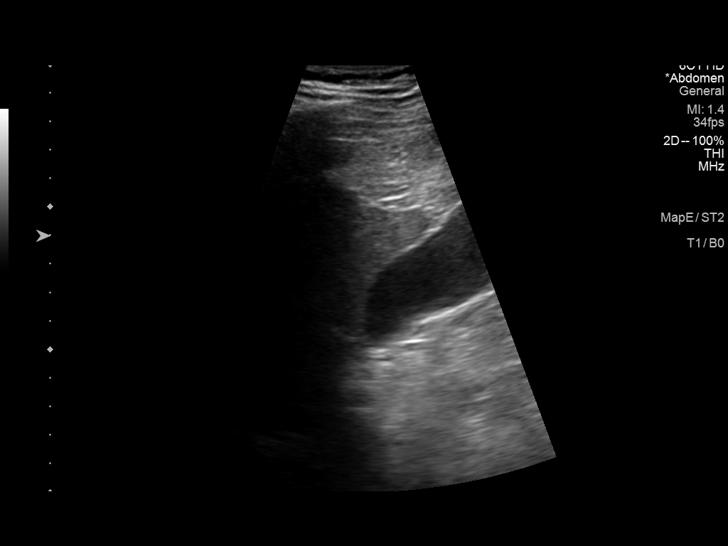
[im 7/37]
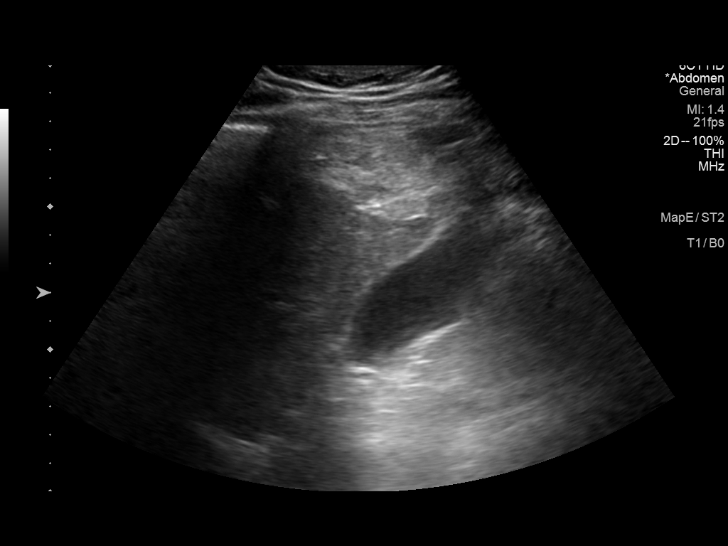
[im 10/37]
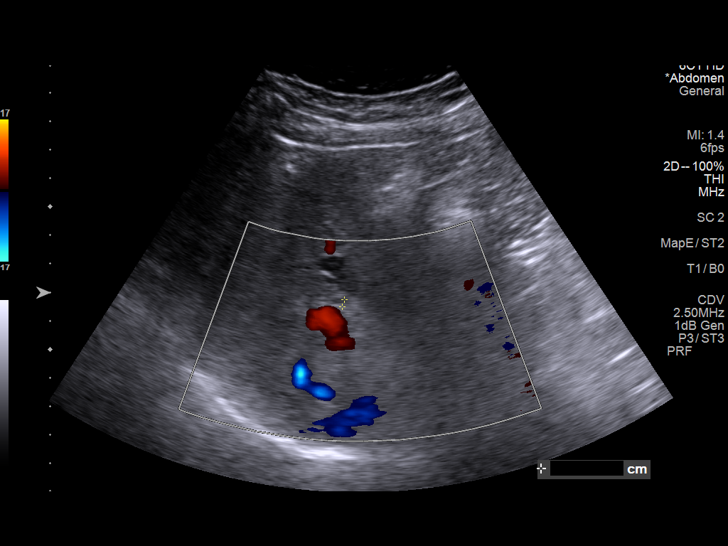
[im 13/37]
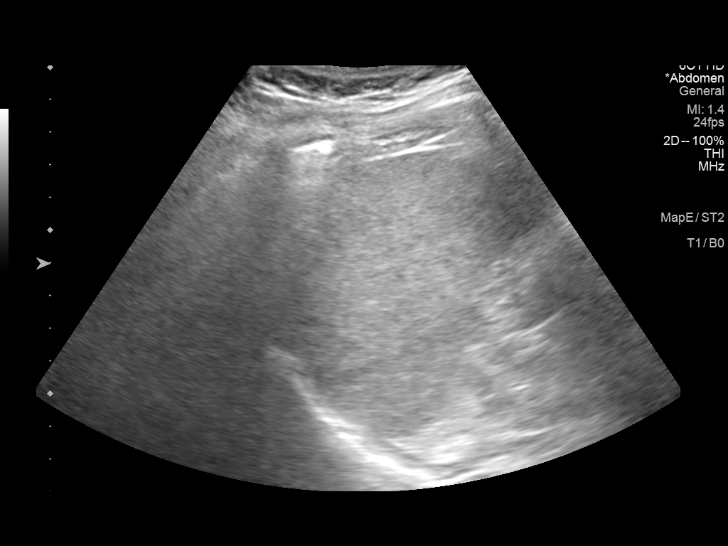
[im 14/37]
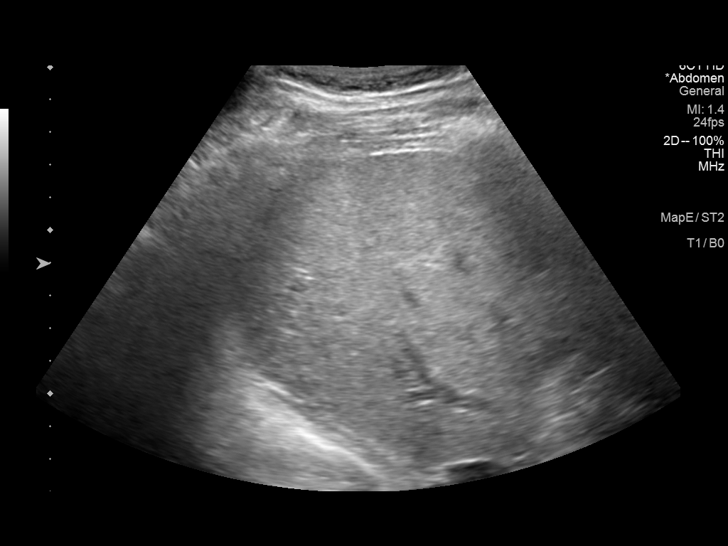
[im 17/37]
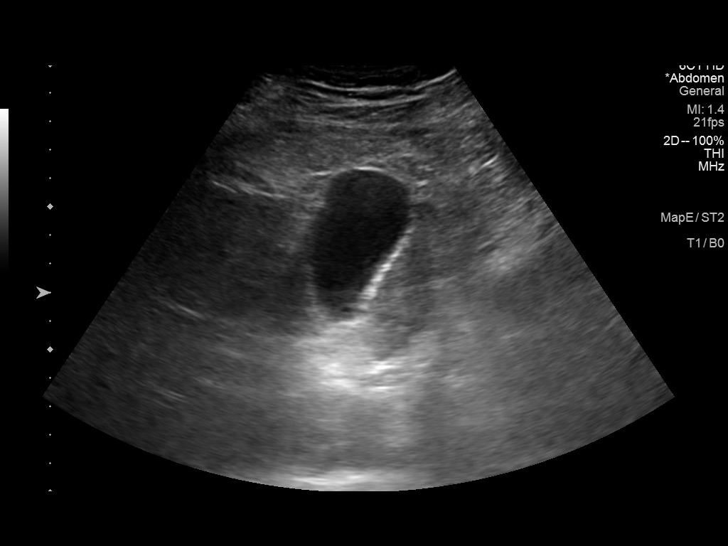
[im 20/37]
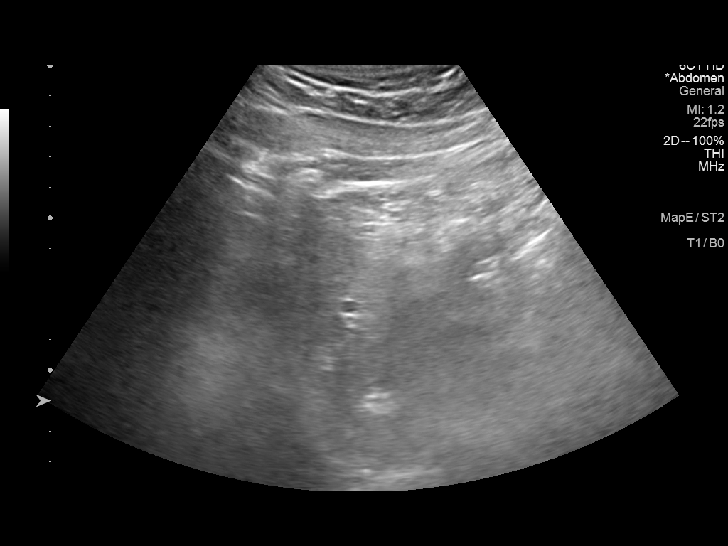
[im 23/37]
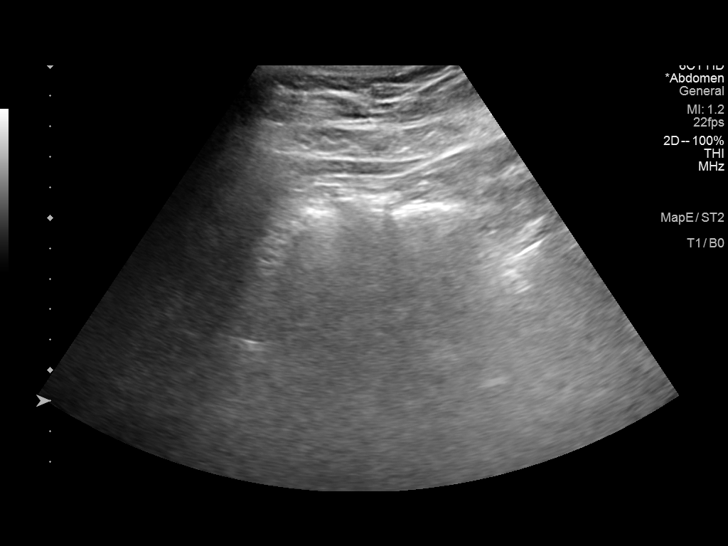
[im 25/37]
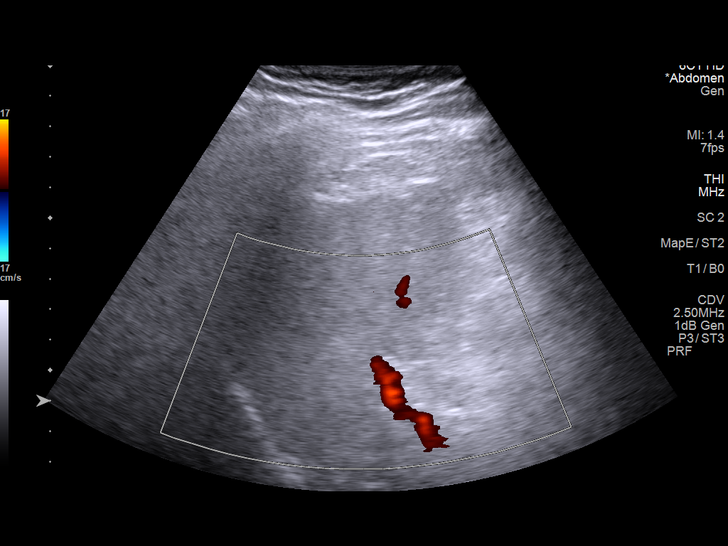
[im 28/37]
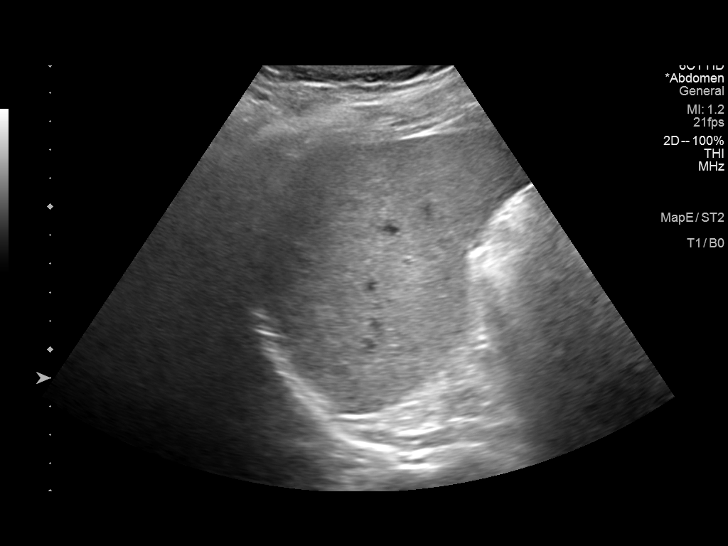
[im 31/37]
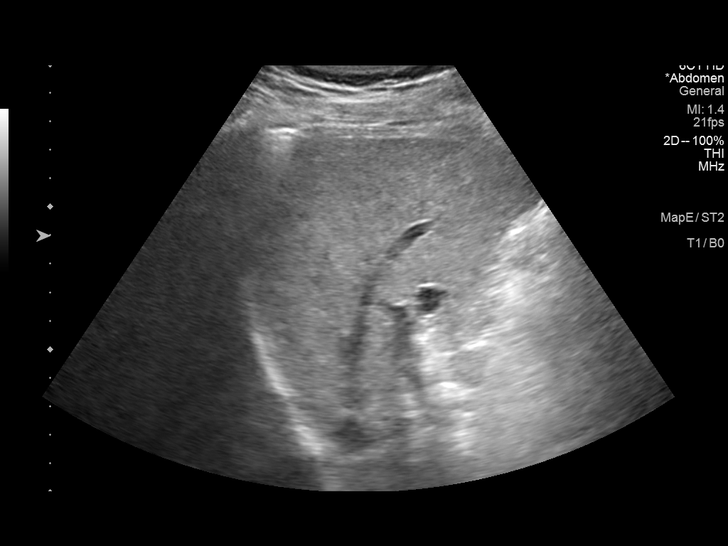
[im 34/37]
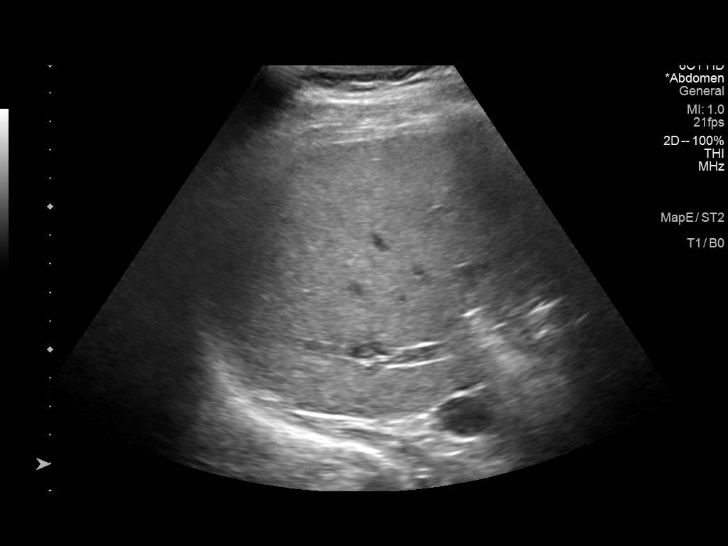
[im 37/37]
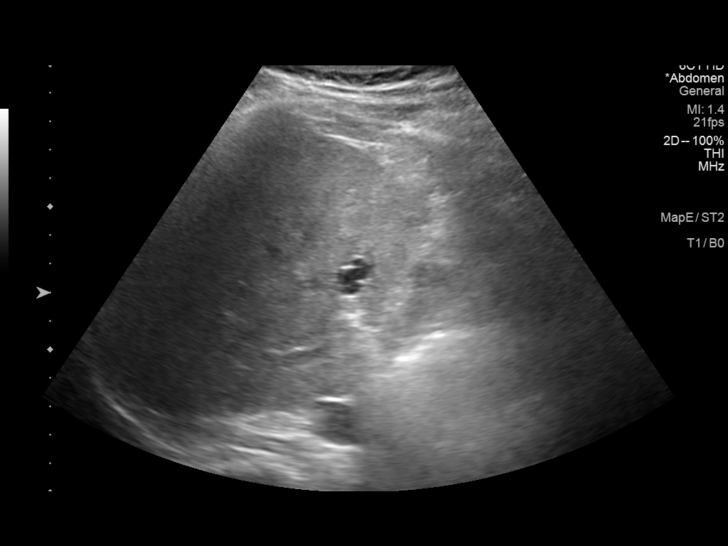

[14 of 25 positions shown; findings below may reference images not displayed]

FINDINGS: Gallbladder:

No gallstones or wall thickening visualized. No sonographic Murphy
sign noted by sonographer.

Common bile duct:

Diameter: 2.6 mm

Liver:

Heterogeneous hepatic parenchymal pattern with slightly nodular
contour consistent patient's known cirrhosis again noted. 1.2 cm
benign-appearing cyst again noted. No other focal abnormality
identified. Portal vein is patent on color Doppler imaging with
normal direction of blood flow towards the liver.

Other: Exam limited due to overlying bowel gas.
IMPRESSION: 1. Heterogeneous hepatic parenchymal pattern with slightly nodular
contour consistent with the patient's known cirrhosis again noted.
1.2 cm benign-appearing hepatic cyst again noted. No other focal
hepatic abnormality identified. Normal portal venous flow.

2.  No gallstones or biliary distention.

## 2023-09-13 IMAGING — US US ABDOMEN LIMITED
1 series · 14 of 25 positions shown · non-contrast
Comparison: Most recent ultrasound 08/30/2021

CLINICAL DATA: HCC screening. Chronic hepatitis-C. Cirrhosis of
liver.

EXAM:
ULTRASOUND ABDOMEN LIMITED RIGHT UPPER QUADRANT

[Series 1: us abdomen limited · 0.22mm/px · 14 of 50 slices shown]
[im 1/50]
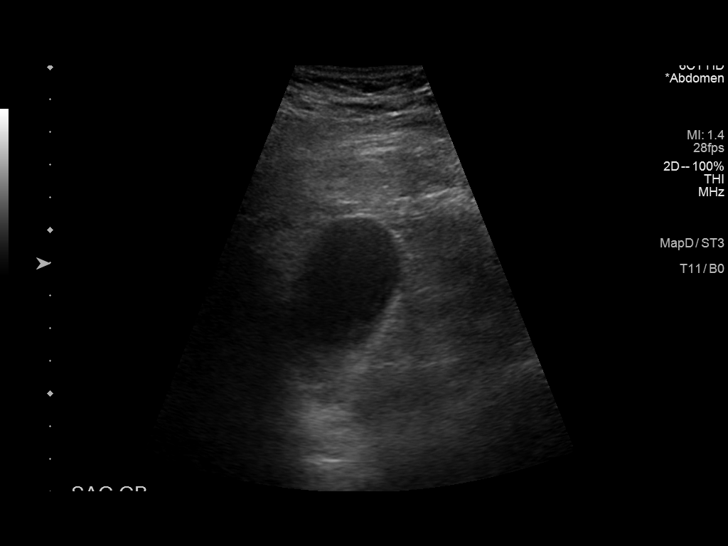
[im 5/50]
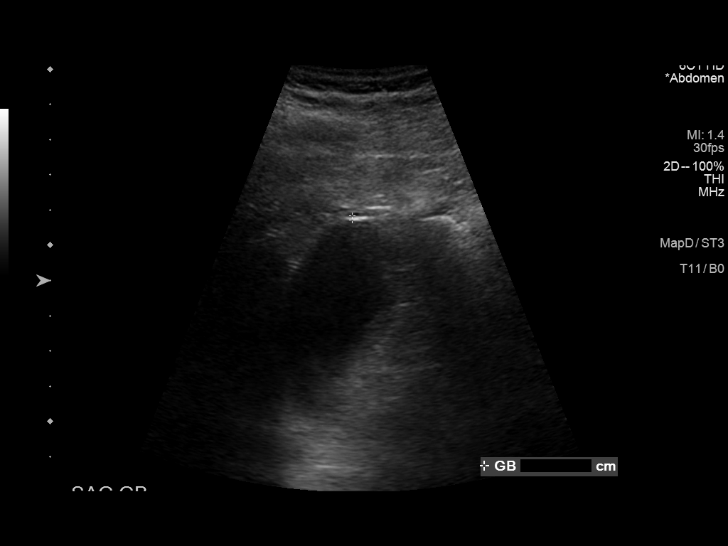
[im 9/50]
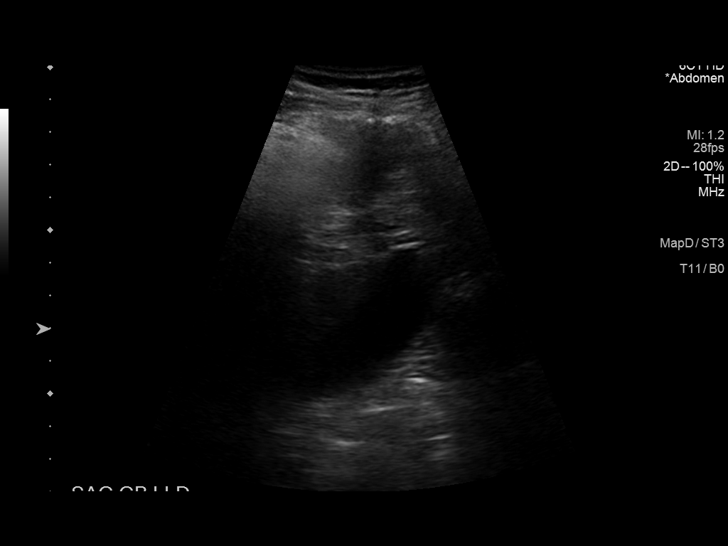
[im 13/50]
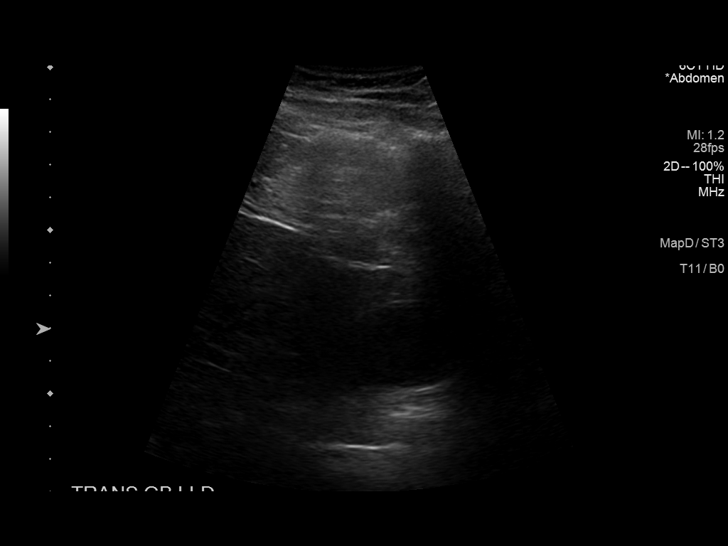
[im 17/50]
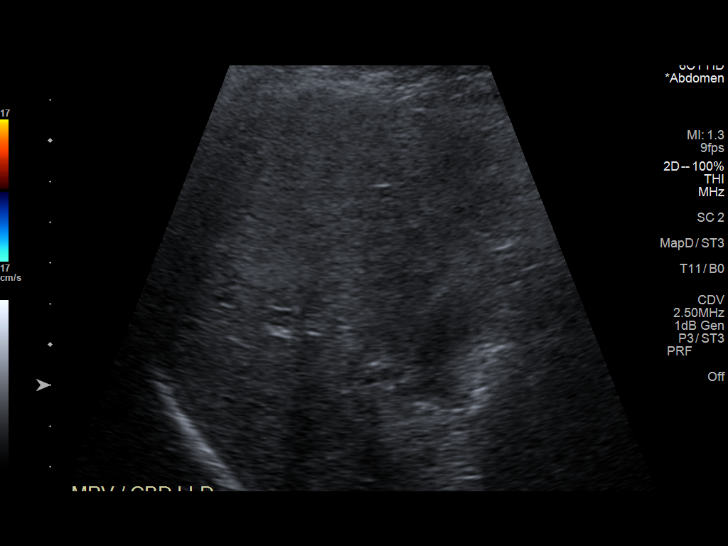
[im 19/50]
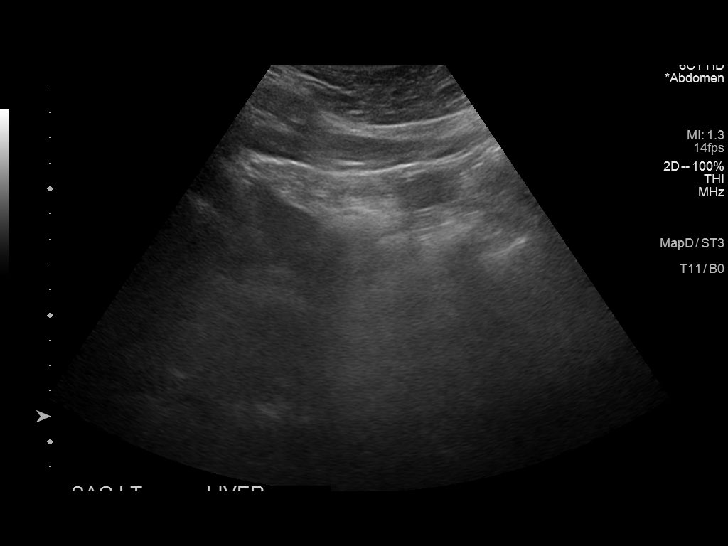
[im 23/50]
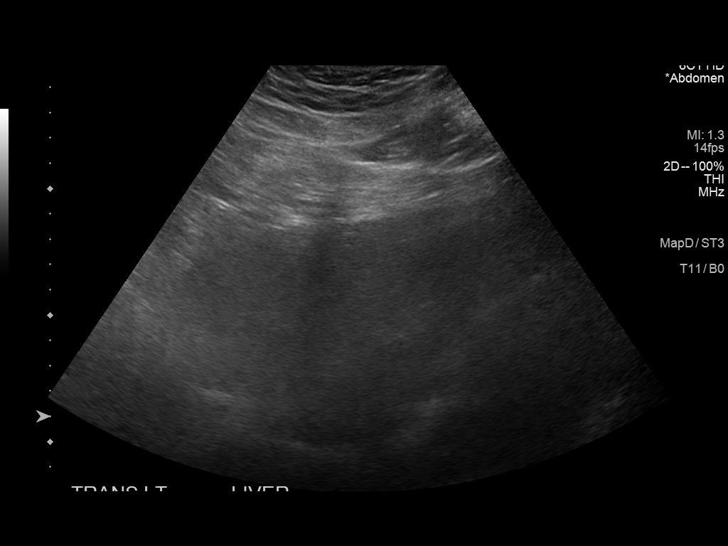
[im 27/50]
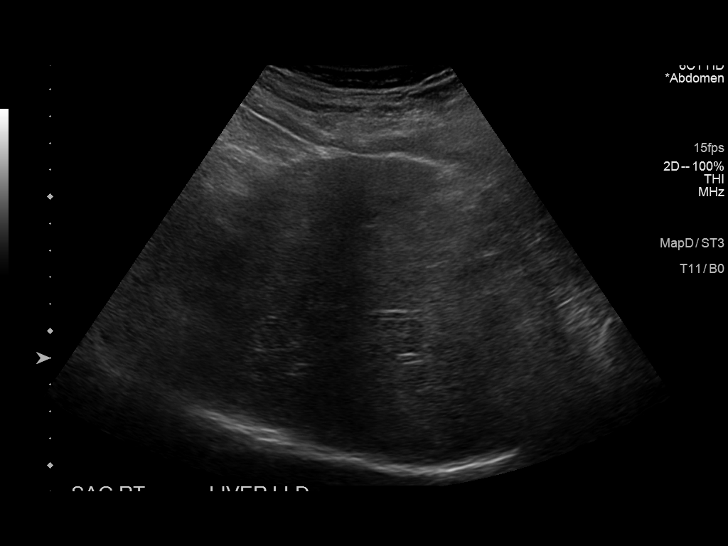
[im 31/50]
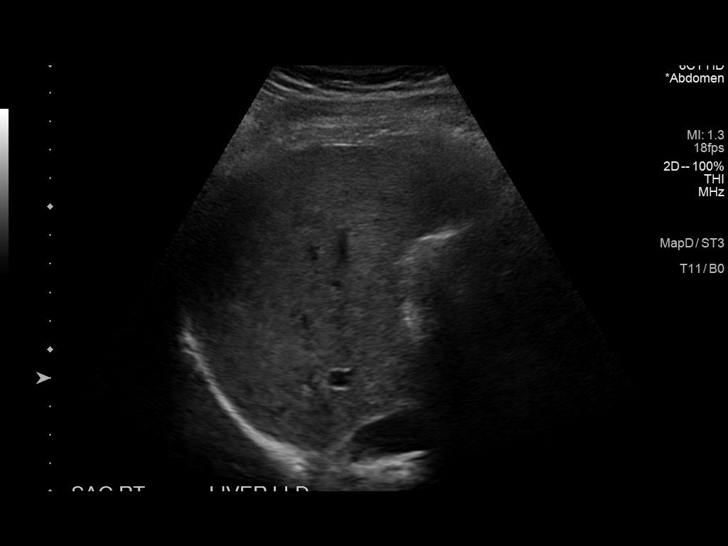
[im 33/50]
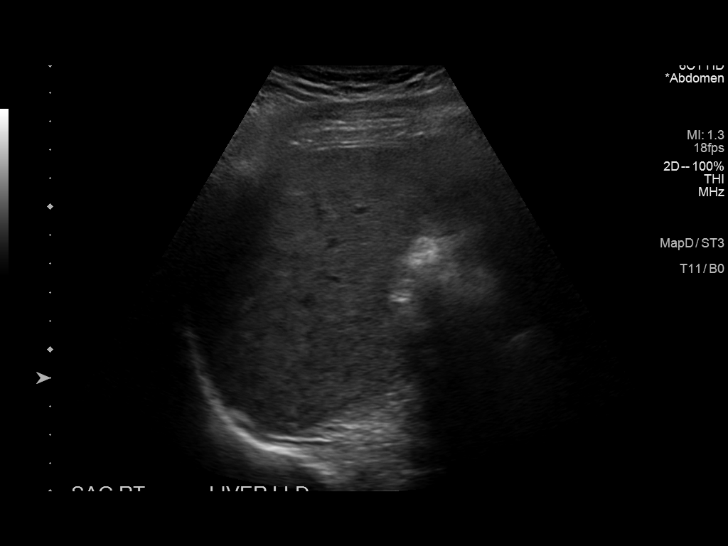
[im 37/50]
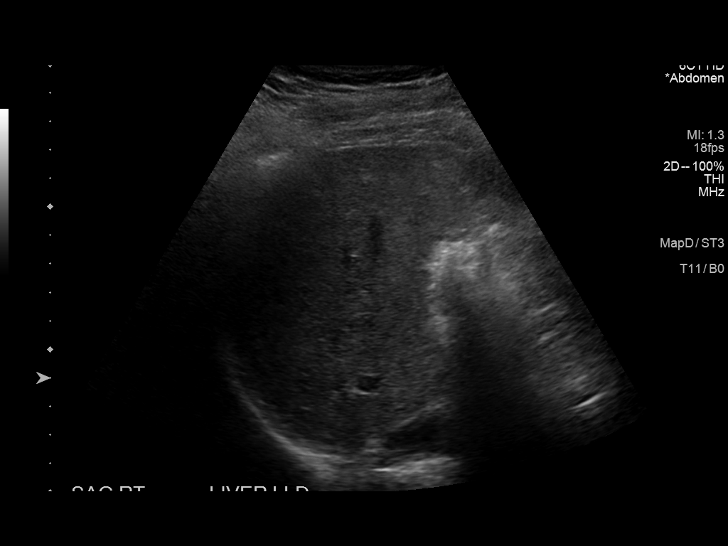
[im 41/50]
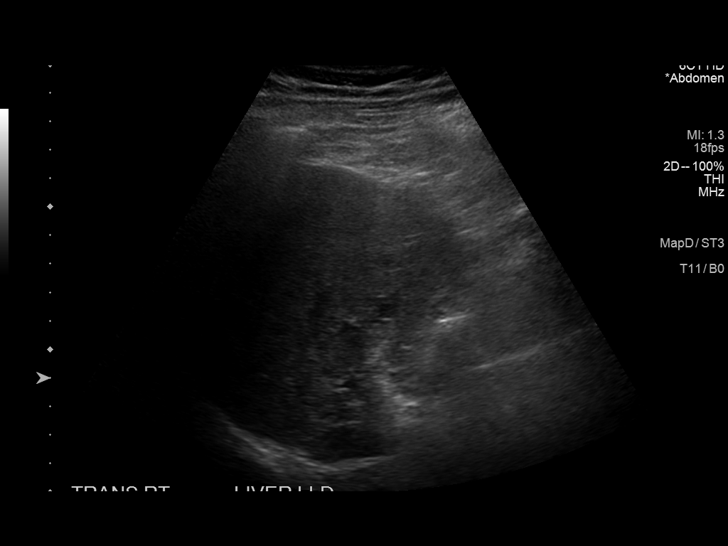
[im 45/50]
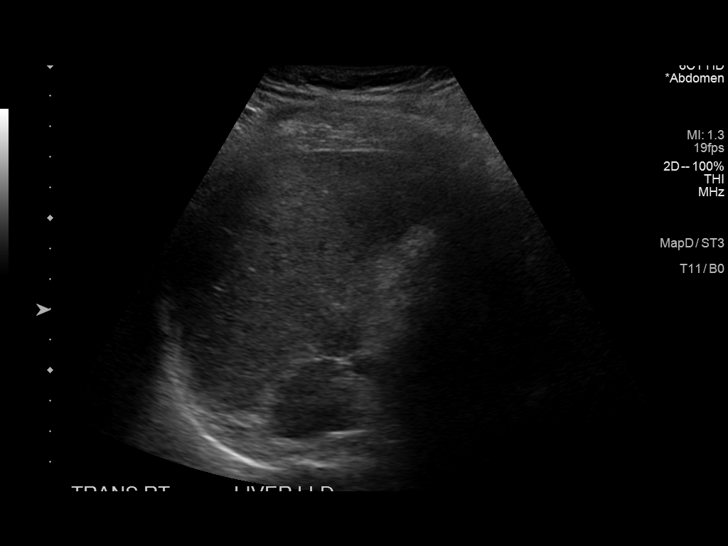
[im 50/50]
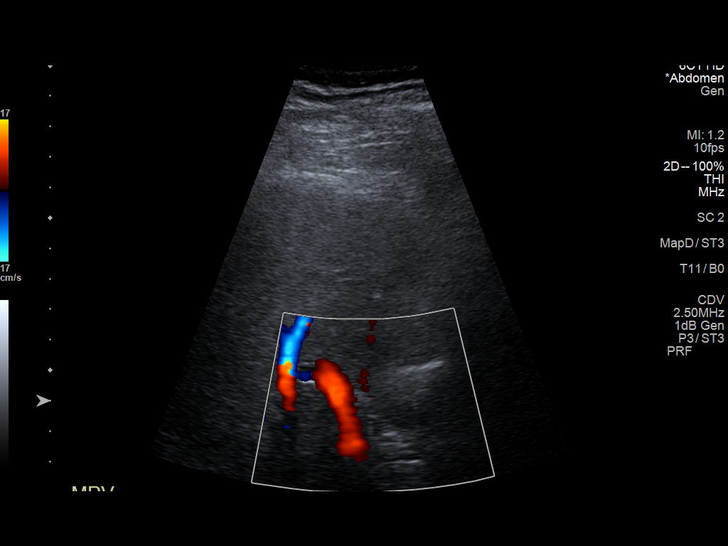

[14 of 25 positions shown; findings below may reference images not displayed]

FINDINGS: Gallbladder:

Physiologically distended. No gallstones or wall thickening
visualized. No sonographic Murphy sign noted by sonographer.

Common bile duct:

Diameter: 2-3 mm, normal.

Liver:

Heterogeneous and coarsened hepatic parenchymal echogenicity. Cyst
in the right lobe of the liver measures 1.2 cm with thin internal
septation. This is unchanged in size from prior exam. No solid
lesion. There is subtle capsular nodularity. Portal vein is patent
on color Doppler imaging with normal direction of blood flow towards
the liver.

Other: No right upper quadrant ascites.
IMPRESSION: 1. Stable cyst in the right hepatic lobe. No new or solid/suspicious
liver lesions.
2. Heterogeneous coarsened hepatic parenchymal echogenicity
consistent with provided history of cirrhosis.
3. No right upper quadrant ascites.

## 2024-02-28 ENCOUNTER — Telehealth: Payer: Self-pay

## 2024-02-28 ENCOUNTER — Other Ambulatory Visit (HOSPITAL_COMMUNITY): Payer: Self-pay

## 2024-02-28 NOTE — Telephone Encounter (Signed)
 Pharmacy Patient Advocate Encounter  Insurance verification completed.   The patient is insured through [employer] claim for [employer]. Currently a quantity of 84 is a 28 day supply and the co-pay is Medication will need a PA & Mavyret is the Preferred on plan.   Epclusa & Harvoni is non formulary on plan.  This test claim was processed through [facility]- copay amounts may vary at other pharmacies due to pharmacy/plan contracts, or as the patient moves through the different stages of their insurance plan.

## 2024-03-05 ENCOUNTER — Ambulatory Visit (INDEPENDENT_AMBULATORY_CARE_PROVIDER_SITE_OTHER): Admitting: Internal Medicine

## 2024-03-05 ENCOUNTER — Other Ambulatory Visit: Payer: Self-pay

## 2024-03-05 ENCOUNTER — Encounter: Payer: Self-pay | Admitting: Internal Medicine

## 2024-03-05 VITALS — BP 142/82 | HR 77 | Temp 98.3°F | Ht 66.0 in | Wt 185.0 lb

## 2024-03-05 DIAGNOSIS — K7460 Unspecified cirrhosis of liver: Secondary | ICD-10-CM | POA: Diagnosis not present

## 2024-03-05 DIAGNOSIS — B1920 Unspecified viral hepatitis C without hepatic coma: Secondary | ICD-10-CM | POA: Diagnosis not present

## 2024-03-05 NOTE — Progress Notes (Signed)
 Patient Active Problem List   Diagnosis Date Noted   Transaminitis 08/23/2021   Cirrhosis of liver without ascites (HCC) 04/12/2018   Hepatic cyst 09/07/2017   Great toe pain 06/07/2017   Abnormal TSH 02/02/2017   Bilateral ganglion cysts of wrists 01/25/2016   Bilateral knee pain 10/30/2014   Lower back pain 07/25/2013   Right ankle sprain 03/24/2011   Hyperglycemia 03/23/2011   Preventative health care 03/23/2011   OSTEOARTHRITIS, KNEES, BILATERAL 04/30/2008   BURSITIS, LEFT HIP 04/30/2008   Chronic hepatitis C without hepatic coma (HCC) 12/05/2007   Hyperlipidemia 12/05/2007   Allergic rhinitis 12/05/2007    Patient's Medications  New Prescriptions   No medications on file  Previous Medications   ALLOPURINOL (ZYLOPRIM) 100 MG TABLET    Take by mouth.   MULTIPLE VITAMIN (MULTIVITAMIN PO)    Take by mouth daily at 6 (six) AM.  Modified Medications   No medications on file  Discontinued Medications   No medications on file    Subjective: 84 YM with PMHx as below presents for follow-up of HCV SPcompleted 12 weeks of Epclusa  completed march 2019 ith SVR(genotrype 1b, f3-f4 on elastography, f2 fibrosure)c/b cirrhosis getting twice/year u/s.   No new complaints.  Review of Systems: Review of Systems  All other systems reviewed and are negative.   Past Medical History:  Diagnosis Date   ALLERGIC RHINITIS 12/05/2007   BURSITIS, LEFT HIP 04/30/2008   GLUCOSE INTOLERANCE 12/05/2007   HEPATITIS C 12/05/2007   HYPERLIPIDEMIA 12/05/2007   Impaired glucose tolerance 03/23/2011   KNEE PAIN, BILATERAL 03/09/2010   OSTEOARTHRITIS, KNEES, BILATERAL 04/30/2008    Social History   Tobacco Use   Smoking status: Never   Smokeless tobacco: Never  Substance Use Topics   Alcohol use: No   Drug use: Never    Family History  Problem Relation Age of Onset   Diabetes Mother    Hypertension Mother    COPD Father    Heart disease Father        CHF    No Known  Allergies  Health Maintenance  Topic Date Due   DTaP/Tdap/Td (2 - Tdap) 02/25/2019   Colonoscopy  04/29/2019   Pneumonia Vaccine 46+ Years old (2 of 2 - PCV) 08/05/2021   COVID-19 Vaccine (3 - 2024-25 season) 07/16/2023   INFLUENZA VACCINE  06/14/2024   Medicare Annual Wellness (AWV)  02/22/2025   Hepatitis C Screening  Completed   Zoster Vaccines- Shingrix  Completed   HPV VACCINES  Aged Out   Meningococcal B Vaccine  Aged Out    Objective:  Vitals:   03/05/24 1530  BP: (!) 142/82  Pulse: 77  Temp: 98.3 F (36.8 C)  TempSrc: Temporal  SpO2: 99%  Weight: 185 lb (83.9 kg)  Height: 5\' 6"  (1.676 m)   Body mass index is 29.86 kg/m.  Physical Exam Constitutional:      General: He is not in acute distress.    Appearance: He is normal weight. He is not toxic-appearing.  HENT:     Head: Normocephalic and atraumatic.     Right Ear: External ear normal.     Left Ear: External ear normal.     Nose: No congestion or rhinorrhea.     Mouth/Throat:     Mouth: Mucous membranes are moist.     Pharynx: Oropharynx is clear.  Eyes:     Extraocular Movements: Extraocular movements intact.     Conjunctiva/sclera: Conjunctivae  normal.     Pupils: Pupils are equal, round, and reactive to light.  Cardiovascular:     Rate and Rhythm: Normal rate and regular rhythm.     Heart sounds: No murmur heard.    No friction rub. No gallop.  Pulmonary:     Effort: Pulmonary effort is normal.     Breath sounds: Normal breath sounds.  Abdominal:     General: Abdomen is flat. Bowel sounds are normal.     Palpations: Abdomen is soft.  Musculoskeletal:        General: No swelling. Normal range of motion.     Cervical back: Normal range of motion and neck supple.  Skin:    General: Skin is warm and dry.  Neurological:     General: No focal deficit present.     Mental Status: He is oriented to person, place, and time.  Psychiatric:        Mood and Affect: Mood normal.    Physical  Exam   Lab Results Lab Results  Component Value Date   WBC 6.3 01/15/2020   HGB 14.3 01/15/2020   HCT 43.1 01/15/2020   MCV 91.1 01/15/2020   PLT 223 01/15/2020    Lab Results  Component Value Date   CREATININE 1.06 01/15/2020   BUN 13 01/15/2020   NA 140 01/15/2020   K 4.6 01/15/2020   CL 106 01/15/2020   CO2 27 01/15/2020    Lab Results  Component Value Date   ALT 47 (H) 02/07/2020   AST 39 (H) 02/07/2020   GGT 89 (H) 08/31/2017   ALKPHOS 65 02/13/2018   BILITOT 0.5 02/07/2020    Lab Results  Component Value Date   CHOL 190 02/13/2018   HDL 59.10 02/13/2018   LDLCALC 117 (H) 02/13/2018   LDLDIRECT 120.7 11/27/2007   TRIG 67.0 02/13/2018   CHOLHDL 3 02/13/2018   No results found for: "LABRPR", "RPRTITER" No results found for: "HIV1RNAQUANT", "HIV1RNAVL", "CD4TABS"   Problem List Items Addressed This Visit   None  Assessment/Plan HCV SP treatment c/b cirrhosis US  02/23/23 showd stable sst in right hepatic love, cirrhotic liver mrophlogy -02/23/24 cmp stable -Order us  today - f/u in 6months   Manuel Bjornstad, MD [facility] 03/05/2024, 3:38 PM    I have personally spent 41 minutes involved in face-to-face and non-face-to-face activities for this patient on the day of the visit. Professional time spent includes the following activities: Preparing to see the patient (review of tests), Obtaining and/or reviewing separately obtained history (admission/discharge record), Performing a medically appropriate examination and/or evaluation , Ordering medications/tests/procedures, referring and communicating with other health care professionals, Documenting clinical information in the EMR, Independently interpreting results (not separately reported), Communicating results to the patient/family/caregiver, Counseling and educating the patient/family/caregiver and Care coordination (not separately reported).

## 2024-03-15 ENCOUNTER — Ambulatory Visit (HOSPITAL_COMMUNITY)
Admission: RE | Admit: 2024-03-15 | Discharge: 2024-03-15 | Disposition: A | Discharge status: Home / Self Care | Source: Ambulatory Visit | Attending: Internal Medicine

## 2024-03-15 DIAGNOSIS — K7460 Unspecified cirrhosis of liver: Secondary | ICD-10-CM | POA: Insufficient documentation

## 2024-09-03 ENCOUNTER — Other Ambulatory Visit: Payer: Self-pay

## 2024-09-03 ENCOUNTER — Ambulatory Visit: Admitting: Internal Medicine

## 2024-09-03 ENCOUNTER — Encounter: Payer: Self-pay | Admitting: Internal Medicine

## 2024-09-03 VITALS — BP 166/81 | HR 67 | Temp 99.3°F | Ht 66.0 in | Wt 180.0 lb

## 2024-09-03 DIAGNOSIS — Z8619 Personal history of other infectious and parasitic diseases: Secondary | ICD-10-CM | POA: Diagnosis not present

## 2024-09-03 DIAGNOSIS — K7460 Unspecified cirrhosis of liver: Secondary | ICD-10-CM | POA: Diagnosis not present

## 2024-09-03 NOTE — Progress Notes (Signed)
 Patient: Manuel Berger  DOB: November 18, 1955 MRN: [id number] PCP: Jolee Madelin Patch, MD    Chief Complaint  Patient presents with   Follow-up     Patient Active Problem List   Diagnosis Date Noted   Transaminitis 08/23/2021   Cirrhosis of liver without ascites (HCC) 04/12/2018   Hepatic cyst 09/07/2017   Great toe pain 06/07/2017   Abnormal TSH 02/02/2017   Bilateral ganglion cysts of wrists 01/25/2016   Bilateral knee pain 10/30/2014   Lower back pain 07/25/2013   Right ankle sprain 03/24/2011   Hyperglycemia 03/23/2011   Preventative health care 03/23/2011   OSTEOARTHRITIS, KNEES, BILATERAL 04/30/2008   BURSITIS, LEFT HIP 04/30/2008   Chronic hepatitis C without hepatic coma (HCC) 12/05/2007   Hyperlipidemia 12/05/2007   Allergic rhinitis 12/05/2007     Subjective:  68 YM with PMHx as below presents for follow-up of HCV SPcompleted 12 weeks of Epclusa  completed march 2019 ith SVR(genotrype 1b, f3-f4 on elastography, f2 fibrosure)c/b cirrhosis getting twice/year u/s.   No new complaints.   Review of Systems  All other systems reviewed and are negative.   Past Medical History:  Diagnosis Date   ALLERGIC RHINITIS 12/05/2007   BURSITIS, LEFT HIP 04/30/2008   GLUCOSE INTOLERANCE 12/05/2007   HEPATITIS C 12/05/2007   HYPERLIPIDEMIA 12/05/2007   Impaired glucose tolerance 03/23/2011   KNEE PAIN, BILATERAL 03/09/2010   OSTEOARTHRITIS, KNEES, BILATERAL 04/30/2008    Outpatient Medications Prior to Visit  Medication Sig Dispense Refill   allopurinol (ZYLOPRIM) 100 MG tablet Take by mouth.     Multiple Vitamin (MULTIVITAMIN PO) Take by mouth daily at 6 (six) AM. (Patient not taking: Reported on 09/03/2024)     No facility-administered medications prior to visit.     No Known Allergies  Social History   Tobacco Use   Smoking status: Never   Smokeless tobacco: Never  Substance Use Topics   Alcohol use: No   Drug use: Never    Family History  Problem  Relation Age of Onset   Diabetes Mother    Hypertension Mother    COPD Father    Heart disease Father        CHF    Objective:   Vitals:   09/03/24 1437  BP: (!) 166/81  Pulse: 67  Temp: 99.3 F (37.4 C)  TempSrc: Oral  SpO2: 97%  Weight: 180 lb (81.6 kg)  Height: 5' 6 (1.676 m)   Body mass index is 29.05 kg/m.  Physical Exam Constitutional:      General: He is not in acute distress.    Appearance: He is normal weight. He is not toxic-appearing.  HENT:     Head: Normocephalic and atraumatic.     Right Ear: External ear normal.     Left Ear: External ear normal.     Nose: No congestion or rhinorrhea.     Mouth/Throat:     Mouth: Mucous membranes are moist.     Pharynx: Oropharynx is clear.  Eyes:     Extraocular Movements: Extraocular movements intact.     Conjunctiva/sclera: Conjunctivae normal.     Pupils: Pupils are equal, round, and reactive to light.  Cardiovascular:     Rate and Rhythm: Normal rate and regular rhythm.     Heart sounds: No murmur heard.    No friction rub. No gallop.  Pulmonary:     Effort: Pulmonary effort is normal.     Breath sounds: Normal breath sounds.  Abdominal:  General: Abdomen is flat. Bowel sounds are normal.     Palpations: Abdomen is soft.  Musculoskeletal:        General: No swelling. Normal range of motion.     Cervical back: Normal range of motion and neck supple.  Skin:    General: Skin is warm and dry.  Neurological:     General: No focal deficit present.     Mental Status: He is oriented to person, place, and time.  Psychiatric:        Mood and Affect: Mood normal.     Lab Results: Lab Results  Component Value Date   WBC 6.3 01/15/2020   HGB 14.3 01/15/2020   HCT 43.1 01/15/2020   MCV 91.1 01/15/2020   PLT 223 01/15/2020    Lab Results  Component Value Date   CREATININE 1.06 01/15/2020   BUN 13 01/15/2020   NA 140 01/15/2020   K 4.6 01/15/2020   CL 106 01/15/2020   CO2 27 01/15/2020    Lab  Results  Component Value Date   ALT 47 (H) 02/07/2020   AST 39 (H) 02/07/2020   GGT 89 (H) 08/31/2017   ALKPHOS 65 02/13/2018   BILITOT 0.5 02/07/2020     Assessment & Plan:  -HCV SP treatment c/b cirrhosis  -Reviewed may 2025 us  showed no acute abnormalities, nodular contour with coarse echotexture of liver suggestive of cirrhosis.  Labs today US  in may 2026 scheduled for us  (q 6 months ideal) F/u in June 2026  Loney Stank, MD [facility]   09/03/24  2:41 PM I have personally spent 45 minutes involved in face-to-face and non-face-to-face activities for this patient on the day of the visit. Professional time spent includes the following activities: Preparing to see the patient (review of tests), Obtaining and/or reviewing separately obtained history (admission/discharge record), Performing a medically appropriate examination and/or evaluation , Ordering medications/tests/procedures, referring and communicating with other health care professionals, Documenting clinical information in the EMR, Independently interpreting results (not separately reported), Communicating results to the patient/family/caregiver, Counseling and educating the patient/family/caregiver and Care coordination (not separately reported).

## 2024-09-03 NOTE — Patient Instructions (Signed)
 Labs today Please schedule us  for May 2026

## 2024-09-04 ENCOUNTER — Ambulatory Visit: Admitting: Internal Medicine

## 2024-09-04 LAB — COMPLETE METABOLIC PANEL WITHOUT GFR
AG Ratio: 1.3 (calc) (ref 1.0–2.5)
ALT: 51 U/L — ABNORMAL HIGH (ref 9–46)
AST: 41 U/L — ABNORMAL HIGH (ref 10–35)
Albumin: 4.5 g/dL (ref 3.6–5.1)
Alkaline phosphatase (APISO): 83 U/L (ref 35–144)
BUN: 13 mg/dL (ref 7–25)
CO2: 27 mmol/L (ref 20–32)
Calcium: 9.1 mg/dL (ref 8.6–10.3)
Chloride: 103 mmol/L (ref 98–110)
Creat: 1.23 mg/dL (ref 0.70–1.35)
Globulin: 3.5 g/dL (ref 1.9–3.7)
Glucose, Bld: 93 mg/dL (ref 65–99)
Potassium: 3.9 mmol/L (ref 3.5–5.3)
Sodium: 140 mmol/L (ref 135–146)
Total Bilirubin: 0.4 mg/dL (ref 0.2–1.2)
Total Protein: 8 g/dL (ref 6.1–8.1)

## 2024-09-04 LAB — CBC WITH DIFFERENTIAL/PLATELET
Absolute Lymphocytes: 2093 {cells}/uL (ref 850–3900)
Absolute Monocytes: 400 {cells}/uL (ref 200–950)
Basophils Absolute: 51 {cells}/uL (ref 0–200)
Basophils Relative: 1.1 %
Eosinophils Absolute: 377 {cells}/uL (ref 15–500)
Eosinophils Relative: 8.2 %
HCT: 42.2 % (ref 38.5–50.0)
Hemoglobin: 13.8 g/dL (ref 13.2–17.1)
MCH: 30.3 pg (ref 27.0–33.0)
MCHC: 32.7 g/dL (ref 32.0–36.0)
MCV: 92.7 fL (ref 80.0–100.0)
MPV: 11.5 fL (ref 7.5–12.5)
Monocytes Relative: 8.7 %
Neutro Abs: 1679 {cells}/uL (ref 1500–7800)
Neutrophils Relative %: 36.5 %
Platelets: 206 Thousand/uL (ref 140–400)
RBC: 4.55 Million/uL (ref 4.20–5.80)
RDW: 13 % (ref 11.0–15.0)
Total Lymphocyte: 45.5 %
WBC: 4.6 Thousand/uL (ref 3.8–10.8)

## 2024-09-04 LAB — PROTIME-INR
INR: 1
Prothrombin Time: 10.8 s (ref 9.0–11.5)

## 2024-12-16 ENCOUNTER — Encounter (HOSPITAL_COMMUNITY): Payer: Self-pay

## 2024-12-16 ENCOUNTER — Ambulatory Visit (HOSPITAL_COMMUNITY)
Admission: EM | Admit: 2024-12-16 | Discharge: 2024-12-16 | Disposition: A | Discharge status: Home / Self Care | Source: Home / Self Care

## 2024-12-16 ENCOUNTER — Other Ambulatory Visit: Payer: Self-pay

## 2024-12-16 DIAGNOSIS — S025XXA Fracture of tooth (traumatic), initial encounter for closed fracture: Secondary | ICD-10-CM | POA: Diagnosis not present

## 2024-12-16 DIAGNOSIS — S0181XA Laceration without foreign body of other part of head, initial encounter: Secondary | ICD-10-CM | POA: Diagnosis not present

## 2024-12-16 MED ORDER — LIDOCAINE-EPINEPHRINE 1 %-1:100000 IJ SOLN
INTRAMUSCULAR | Status: AC
  Filled 2024-12-16: qty 1

## 2024-12-16 MED ORDER — CHLORHEXIDINE GLUCONATE 0.12 % MT SOLN: 15.0000 mL | Freq: Two times a day (BID) | OROMUCOSAL | 0 refills | Status: AC

## 2024-12-16 NOTE — ED Triage Notes (Signed)
 Pt presents with c/o facial laceration and broken tooth today. Spouse states pt was attempting to close a garage door. Two lacerations noted. One under L eye and one on left side of lip. Bleeding is controlled in triage room. Currently rates overall pain a 7/10. No medications taken PTA for sxs/pain. Not on blood thinners.
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
## 2024-12-16 NOTE — Discharge Instructions (Signed)
 Your lacerations were repaired today. Wound care:  - Keep wound completely dry for 24 hours. After 24 hours, you may gently wash the wound by allowing water to run over the wound. You may also use a small amount of antibacterial soap.  Do not scrub the wound as this can cause damage to the sutures/staples.  - You may place a bandaid on facial lacerations if they are oozing.  - No need for ointments or lotions to the wound.  You should have the sutures removed in 7-10 days by your primary care provider or at urgent care. Return sooner if you experience discharge from your laceration, redness around your laceration, warmth around your laceration, or fever.   You may take Tylenol and/or ibuprofen as needed for aches and pains once the numbing wears off.    Schedule an appointment with your dentist for your broken teeth.

## 2025-03-17 ENCOUNTER — Ambulatory Visit (HOSPITAL_COMMUNITY)

## 2025-05-05 ENCOUNTER — Ambulatory Visit: Admitting: Internal Medicine
